# Patient Record
Sex: Male | Born: 1970 | Race: White | Hispanic: No | Marital: Married | Smoking: Former smoker
Health system: Southern US, Community
[De-identification: ages and names within clinical notes are randomized; demographics above are authoritative.]

## PROBLEM LIST (undated history)

## (undated) DIAGNOSIS — I10 Essential (primary) hypertension: Secondary | ICD-10-CM

## (undated) DIAGNOSIS — I350 Nonrheumatic aortic (valve) stenosis: Principal | ICD-10-CM

## (undated) DIAGNOSIS — K219 Gastro-esophageal reflux disease without esophagitis: Secondary | ICD-10-CM

## (undated) DIAGNOSIS — M109 Gout, unspecified: Secondary | ICD-10-CM

## (undated) DIAGNOSIS — E785 Hyperlipidemia, unspecified: Secondary | ICD-10-CM

## (undated) DIAGNOSIS — IMO0001 Reserved for inherently not codable concepts without codable children: Secondary | ICD-10-CM

## (undated) DIAGNOSIS — T4145XA Adverse effect of unspecified anesthetic, initial encounter: Secondary | ICD-10-CM

## (undated) DIAGNOSIS — R51 Headache: Secondary | ICD-10-CM

## (undated) DIAGNOSIS — I4891 Unspecified atrial fibrillation: Secondary | ICD-10-CM

## (undated) DIAGNOSIS — R42 Dizziness and giddiness: Secondary | ICD-10-CM

## (undated) DIAGNOSIS — R519 Headache, unspecified: Secondary | ICD-10-CM

## (undated) DIAGNOSIS — Z87442 Personal history of urinary calculi: Secondary | ICD-10-CM

## (undated) DIAGNOSIS — M1990 Unspecified osteoarthritis, unspecified site: Secondary | ICD-10-CM

## (undated) DIAGNOSIS — T8859XA Other complications of anesthesia, initial encounter: Secondary | ICD-10-CM

## (undated) DIAGNOSIS — R011 Cardiac murmur, unspecified: Secondary | ICD-10-CM

## (undated) HISTORY — PX: CARDIAC CATHETERIZATION: SHX172

## (undated) HISTORY — DX: Gout, unspecified: M10.9

## (undated) HISTORY — DX: Unspecified atrial fibrillation: I48.91

## (undated) HISTORY — DX: Nonrheumatic aortic (valve) stenosis: I35.0

## (undated) HISTORY — PX: HERNIA REPAIR: SHX51

## (undated) HISTORY — DX: Essential (primary) hypertension: I10

## (undated) HISTORY — DX: Unspecified osteoarthritis, unspecified site: M19.90

## (undated) HISTORY — DX: Cardiac murmur, unspecified: R01.1

## (undated) HISTORY — PX: OTHER SURGICAL HISTORY: SHX169

## (undated) HISTORY — DX: Hyperlipidemia, unspecified: E78.5

## (undated) HISTORY — PX: LITHOTRIPSY: SUR834

---

## 2016-07-09 ENCOUNTER — Other Ambulatory Visit: Payer: Self-pay | Admitting: *Deleted

## 2016-07-09 ENCOUNTER — Encounter: Payer: Self-pay | Admitting: Cardiothoracic Surgery

## 2016-07-09 ENCOUNTER — Institutional Professional Consult (permissible substitution) (INDEPENDENT_AMBULATORY_CARE_PROVIDER_SITE_OTHER): Payer: Managed Care, Other (non HMO) | Admitting: Cardiothoracic Surgery

## 2016-07-09 DIAGNOSIS — I350 Nonrheumatic aortic (valve) stenosis: Secondary | ICD-10-CM | POA: Diagnosis not present

## 2016-07-09 DIAGNOSIS — Q231 Congenital insufficiency of aortic valve: Secondary | ICD-10-CM

## 2016-07-09 HISTORY — DX: Nonrheumatic aortic (valve) stenosis: I35.0

## 2016-07-09 NOTE — Progress Notes (Signed)
PCP is No primary care provider on file. Referring Provider is No ref. provider found  Chief Complaint  Patient presents with  . Aortic Stenosis    Surgical eval, Cardiac Cath 07/04/16, ECHO 06/27/16  Patient examined, cardiac catheterization images and right heart cath data personally reviewed and counseled with patient   HPI: I was asked to evaluate this 45 year old obese nondiabetic reformed smoker with a diagnosis of severe aortic stenosis and moderate aortic insufficiency with exertional symptoms of shortness of breath and dizziness-presyncope. The patient was found have a cardiac murmur and 2010. At that time echocardiogram demonstrated no significant valvular abnormalities according to the patient. At that time he was asymptomatic. No further echoes were performed until recent onset of symptoms-dyspnea on exertion and 2 or 3 episodes of exertional presyncope or dizziness. The patient denies any resting symptoms of chest pain, orthopnea, PND, or ankle edema. There is no family history of aortic valve disease. The patients cardiologist, Dr. Daryel November, feels the patient has a bicuspid aortic valve. The echo disc the patient brought today does not provide images of the valve to interpret at this time. Left and right heart cath data shows a mildly dilated ascending aorta with well-preserved LV systolic function. He would aortic valve area was 0.8 with a heavily calcified aortic valve. LVEDP is measured at 20 mmHg. Cardiac output was 5.5 L/m. Were no significant coronary artery lesions. There is no bleeding from the groin puncture and no residual hematoma. ` Past Medical History:  Diagnosis Date  . Aortic stenosis 07/09/2016  . Arthritis   . Gout   . Heart murmur   . Hyperlipidemia   . Hypertension     Past Surgical History:  Procedure Laterality Date  . CARDIAC CATHETERIZATION    . I&D right Knee    . kidney stents      Family History  Problem Relation Age of Onset  . Diabetes Mother    . Hypertension Mother   . Asthma Mother   . Hyperlipidemia Father   . Hypertension Father     Social History Social History  Substance Use Topics  . Smoking status: Former Smoker    Packs/day: 1.00    Years: 15.00    Types: Cigarettes    Quit date: 12/18/2003  . Smokeless tobacco: Current User    Types: Chew  . Alcohol use Yes     Comment: occas.  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  Current Outpatient Prescriptions  Medication Sig Dispense Refill  . allopurinol (ZYLOPRIM) 300 MG tablet Take 300 mg by mouth daily.    Marland Kitchen aspirin 81 MG tablet Take 81 mg by mouth daily.    Marland Kitchen atorvastatin (LIPITOR) 40 MG tablet Take 40 mg by mouth daily at 6 PM.    . carvedilol (COREG) 6.25 MG tablet Take 6.25 mg by mouth 2 (two) times daily with a meal.    . indomethacin (INDOCIN) 50 MG capsule Take 50 mg by mouth 3 (three) times daily as needed.    Marland Kitchen lisinopril (PRINIVIL,ZESTRIL) 20 MG tablet Take 20 mg by mouth daily.     No current facility-administered medications for this visit.     No Known Allergies  Review of Systems        Review of Systems :  [ y ] = yes, [  ] = no        General :  Weight gain [   ]  Weight loss  [   ]  Fatigue [ y ]  Fever [  ]  Chills  [  ]                                Weakness  [  ]           HEENT    Headache [  ]  Dizziness [  ]  Blurred vision [ y ] Glaucoma  [  ]                          Nosebleeds [  ] Painful or loose teeth [  ]        Cardiac :  Chest pain/ pressure [  ]  Resting SOB [  ] exertional SOB Cove.Etienne  ]                        Orthopnea [  ]  Pedal edema  [  ]  Palpitations [  ] Syncope/presyncope Cove.Etienne ]                        Paroxysmal nocturnal dyspnea [  ]         Pulmonary : cough [  ]  wheezing [  ]  Hemoptysis [  ] Sputum [  ] Snoring [  ]                              Pneumothorax [  ]  Sleep apnea [ possible ]        GI : Vomiting [  ]  Dysphagia [  ]  Melena  [  ]  Abdominal pain [  ]  BRBPR [  ]              Heart burn [  ]  Constipation [  ] Diarrhea  [  ] Colonoscopy [   ]        GU : Hematuria [  ]  Dysuria [  ]  Nocturia [  ] UTI's [  ]        Vascular : Claudication [  ]  Rest pain [  ]  DVT [  ] Vein stripping [  ] leg ulcers [  ]                          TIA [  ] Stroke [  ]  Varicose veins [  ]        NEURO :  Headaches  [  ] Seizures [  ] Vision changes [  ] Paresthesias [  ]                                       Seizures [  ]        Musculoskeletal :  Arthritis [  ] Gout  [  yes-status post right knee arthroscopy for gouty arthritis]  Back pain [  ]  Joint pain [ yes ]        Skin :  Rash [  ]  Melanoma [  ] Sores [  ]  Heme : Bleeding problems [  ]Clotting Disorders [  ] Anemia [  ]Blood Transfusion [ ]         Endocrine : Diabetes [  ] Heat or Cold intolerance [  ] Polyuria [  ]excessive thirst [ ]         Psych : Depression [  ]  Anxiety [  ]  Psych hospitalizations [  ] Memory change [  ]      Patient is right-hand dominant      Patient has had general anesthesia for right knee surgery, right inguinal repair and transurethral extraction of kidney stones. No problems with anesthesia or bleeding problems with surgery.                                          BP 107/62 (BP Location: Right Arm, Patient Position: Sitting, Cuff Size: Large)   Pulse 88   Resp 20   Ht 5\' 6"  (1.676 m)   Wt 235 lb (106.6 kg)   SpO2 97% Comment: RA  BMI 37.93 kg/m  Physical Exam      Physical Exam  General: Short statured obese Caucasian male no acute distress HEENT: Normocephalic pupils equal , dentition adequate Neck: Supple without JVD, adenopathy, or bruit, transmitted murmur of aortic stenosis  in the neck Chest: Clear to auscultation, symmetrical breath sounds, no rhonchi, no tenderness             or deformity Cardiovascular: Regular rate and rhythm, 3/6 murmur of AS, 2/6 murmur of AI,, no gallop, peripheral pulses             palpable in all  extremities Abdomen:  Soft, obese, nontender, no palpable mass or organomegaly Extremities: Warm, well-perfused, no clubbing cyanosis edema or tenderness,              no venous stasis changes of the legs Rectal/GU: Deferred Neuro: Grossly non--focal and symmetrical throughout Skin: Clean and dry without rash or ulceration   Diagnostic Tests: Coronary angiogram-no significant CAD LV gram-normal systolic function Aortogram-at least mild dilatation of the ascending aorta Echocardiogram-images not reproducible on disc brought by patient  Impression: Moderate-severe aortic stenosis, moderate aortic insufficiency, symptoms of CHF and presyncope. No significant CAD. Possible dilatation of the ascending aorta related to a bicuspid valve  Plan: I discussed the procedure of aortic valve replacement in detail the patient. At age 62 I recommended mechanical valve replacement and lifelong commitment to Coumadin therapy. The patient is somewhat hesitant to accept that recommendation and will further discuss with his family. I recommended sternotomy as the safest approach for aVR in this short statured obese patient to allow adequate exposure for the largest valve possible. The patient will undergo CT of his thoracic aorta to obtain better assessment of possible fusiform aneurysm. The patient will have a dental exam by his family dentist as he is not been assessed and over 1-2 years. Family will complete a planned vacation at the beach and return him in mid  August to discuss timing of surgery, the choice of valve mechanical versus bio prosthetic, and at that time his CT scan of chest will be reviewed and discussed with patient. He will continue his current medications until then. We discussed activity limitations he should follow until he undergoes aortic valve replacement.  Mikey Bussing, MD [facility] [phone]

## 2016-07-25 LAB — PROTIME-INR

## 2016-08-01 ENCOUNTER — Ambulatory Visit (INDEPENDENT_AMBULATORY_CARE_PROVIDER_SITE_OTHER): Payer: Managed Care, Other (non HMO) | Admitting: Cardiothoracic Surgery

## 2016-08-01 ENCOUNTER — Other Ambulatory Visit: Payer: Self-pay | Admitting: *Deleted

## 2016-08-01 ENCOUNTER — Ambulatory Visit
Admission: RE | Admit: 2016-08-01 | Discharge: 2016-08-01 | Disposition: A | Discharge status: Home / Self Care | Payer: Managed Care, Other (non HMO) | Source: Ambulatory Visit | Attending: Cardiothoracic Surgery | Admitting: Cardiothoracic Surgery

## 2016-08-01 ENCOUNTER — Encounter: Payer: Self-pay | Admitting: Cardiothoracic Surgery

## 2016-08-01 VITALS — BP 103/63 | HR 88 | Resp 20 | Ht 66.0 in | Wt 235.0 lb

## 2016-08-01 DIAGNOSIS — I350 Nonrheumatic aortic (valve) stenosis: Secondary | ICD-10-CM

## 2016-08-01 DIAGNOSIS — Q231 Congenital insufficiency of aortic valve: Secondary | ICD-10-CM

## 2016-08-01 MED ORDER — IOPAMIDOL (ISOVUE-370) INJECTION 76%
80.0000 mL | Freq: Once | INTRAVENOUS | Status: AC | PRN
  Administered 2016-08-01: 80 mL via INTRAVENOUS

## 2016-08-01 NOTE — Progress Notes (Signed)
PCP is No primary care provider on file. Referring Provider is Daryel NovemberMiller, Gary, MD  Chief Complaint  Patient presents with  . Aortic Stenosis    f/u to review CTA Chest   HPI: I was asked to evaluate this 45 year old obese nondiabetic reformed smoker with a diagnosis of severe aortic stenosis and moderate aortic insufficiency with exertional symptoms of shortness of breath and dizziness-presyncope. The patient was found have a cardiac murmur and 2010. At that time echocardiogram demonstrated no significant valvular abnormalities according to the patient. At that time he was asymptomatic. No further echoes were performed until recent onset of symptoms-dyspnea on exertion and 2 or 3 episodes of exertional presyncope or dizziness. The patient denies any resting symptoms of chest pain, orthopnea, PND, or ankle edema. There is no family history of aortic valve disease. The patients cardiologist, Dr. Daryel NovemberGary Miller, feels the patient has a bicuspid aortic valve. The echo disc the patient brought today does not provide images of the valve to interpret at this time. Left and right heart cath data shows a mildly dilated ascending aorta with well-preserved LV systolic function. He would aortic valve area was 0.8 with a heavily calcified aortic valve. LVEDP is measured at 20 mmHg. Cardiac output was 5.5 L/m. Were no significant coronary artery lesions.   Since initial consultation the patient has had some episodes of transient dizziness. His exertional shortness of breath has not worsened. He denies any symptoms of upper respiratory infection. Prior to today's visit the patient underwent a CT scan of the chest which demonstrated the ascending aorta diameter at 4.2 cm which would not require replacement in this patient with a large BSA.  Since the patient's initial consultation he was examined by his dentist and has documentation that his oral hygiene is adequate for valve replacement surgery.  Since initial  consultation the patient and his family have discussed the options of tissue valve versus mechanical valve. They understand that I would recommend mechanical valve due to the patient's young age and the risk of premature calcification. There is still concern over a lifelong commitment to Coumadin therapy and we'll decide on the valve category prior to surgery.   Past Medical History:  Diagnosis Date  . Aortic stenosis 07/09/2016  . Arthritis   . Gout   . Heart murmur   . Hyperlipidemia   . Hypertension     Past Surgical History:  Procedure Laterality Date  . CARDIAC CATHETERIZATION    . I&D right Knee    . kidney stents      Family History  Problem Relation Age of Onset  . Diabetes Mother   . Hypertension Mother   . Asthma Mother   . Hyperlipidemia Father   . Hypertension Father     Social History Social History  Substance Use Topics  . Smoking status: Former Smoker    Packs/day: 1.00    Years: 15.00    Types: Cigarettes    Quit date: 12/18/2003  . Smokeless tobacco: Current User    Types: Chew  . Alcohol use Yes     Comment: occas.    Current Outpatient Prescriptions  Medication Sig Dispense Refill  . allopurinol (ZYLOPRIM) 300 MG tablet Take 300 mg by mouth daily.    Marland Kitchen. aspirin 81 MG tablet Take 81 mg by mouth daily.    Marland Kitchen. atorvastatin (LIPITOR) 40 MG tablet Take 40 mg by mouth daily at 6 PM.    . carvedilol (COREG) 6.25 MG tablet Take 6.25 mg by mouth  2 (two) times daily with a meal.    . indomethacin (INDOCIN) 50 MG capsule Take 50 mg by mouth 3 (three) times daily as needed.    Marland Kitchen. lisinopril (PRINIVIL,ZESTRIL) 20 MG tablet Take 20 mg by mouth daily.     No current facility-administered medications for this visit.     No Known Allergies  Review of Systems: Review of Systems :  [ y ] = yes, [  ] = no         General :  Weight gain [   ]    Weight loss  [   ]  Fatigue [ y ]  Fever [  ]  Chills  [  ]                                Weakness  [  ]             HEENT    Headache [  ]  Dizziness [  ]  Blurred vision [ y ] Glaucoma  [  ]                          Nosebleeds [  ] Painful or loose teeth [  ]         Cardiac :  Chest pain/ pressure [  ]  Resting SOB [  ] exertional SOB Cove.Etienne[y  ]                        Orthopnea [  ]  Pedal edema  [  ]  Palpitations [  ] Syncope/presyncope Cove.Etienne[y ]                        Paroxysmal nocturnal dyspnea [  ]          Pulmonary : cough [  ]  wheezing [  ]  Hemoptysis [  ] Sputum [  ] Snoring [  ]                              Pneumothorax [  ]  Sleep apnea [ possible ]         GI : Vomiting [  ]  Dysphagia [  ]  Melena  [  ]  Abdominal pain [  ] BRBPR [  ]              Heart burn [  ]  Constipation [  ] Diarrhea  [  ] Colonoscopy [   ]         GU : Hematuria [  ]  Dysuria [  ]  Nocturia [  ] UTI's [  ]         Vascular : Claudication [  ]  Rest pain [  ]  DVT [  ] Vein stripping [  ] leg ulcers [  ]                          TIA [  ] Stroke [  ]  Varicose veins [  ]         NEURO :  Headaches  [  ] Seizures [  ] Vision changes [  ] Paresthesias [  ]  Seizures [  ]         Musculoskeletal :  Arthritis [  ] Gout  [  yes-status post right knee arthroscopy for gouty arthritis]  Back pain [  ]  Joint pain [ yes ]         Skin :  Rash [  ]  Melanoma [  ] Sores [  ]         Heme : Bleeding problems [  ]Clotting Disorders [  ] Anemia [  ]Blood Transfusion [ ]          Endocrine : Diabetes [  ] Heat or Cold intolerance [  ] Polyuria [  ]excessive thirst [ ]          Psych : Depression [  ]  Anxiety [  ]  Psych hospitalizations [  ] Memory change [  ]      Patient is right-hand dominant      Patient has had general anesthesia for right knee surgery, right inguinal repair and transurethral extraction of kidney stones. No problems with anesthesia or bleeding problems with surgery.       BP 103/63 (BP Location: Left Arm, Patient Position: Sitting, Cuff Size: Large)   Pulse 88   Resp 20    Ht 5\' 6"  (1.676 m)   Wt 235 lb (106.6 kg)   SpO2 98% Comment: RA  BMI 37.93 kg/m  Physical Exam:       Physical Exam  General: Obese short statured Caucasian male no acute distress accompanied by family HEENT: Normocephalic pupils equal , dentition adequate Neck: Supple without JVD, adenopathy, or bruit Chest: Clear to auscultation, symmetrical breath sounds, no rhonchi, no tenderness             or deformity Cardiovascular: Regular rate and rhythm, 3/6 aortic stenosis murmur, 2/6 murmur of AI, no gallop, peripheral pulses             palpable in all extremities Abdomen:  Soft, nontender, no palpable mass or organomegaly Extremities: Warm, well-perfused, no clubbing cyanosis edema or tenderness,              no venous stasis changes of the legs Rectal/GU: Deferred Neuro: Grossly non--focal and symmetrical throughout Skin: Clean and dry without rash or ulceration   Diagnostic Tests: Review of CT scan images performed personally and counseled with patient and family. Reviewed the results of previous coronary angiogram and echocardiogram performed in [place].  Impression: Moderate to severe aortic stenosis, moderate aortic insufficiency Preserved LV systolic function Aortic peak gradient 74 mmHg, mean gradient 44 mmHg. CT scan of chest shows insignificant dilatation of the ascending aorta Dental clearance for elective valve replacement surgery obtained by patient's personal dentist  Plan: Patient be scheduled for aortic valve replacement on August 29. I have again reviewed in detail the indications benefits alternatives and risks of surgery as well as the expected postoperative hospital recovery. The patient will inform me of his choice of mechanical versus bioprosthetic valve prior to surgery. We have discussed the pros and cons each type at length and dressed all questions.  Mikey Bussing, MD [facility] [phone]

## 2016-08-02 ENCOUNTER — Other Ambulatory Visit: Payer: Self-pay | Admitting: *Deleted

## 2016-08-08 ENCOUNTER — Ambulatory Visit (HOSPITAL_BASED_OUTPATIENT_CLINIC_OR_DEPARTMENT_OTHER)
Admission: RE | Admit: 2016-08-08 | Discharge: 2016-08-08 | Disposition: A | Discharge status: Home / Self Care | Payer: Managed Care, Other (non HMO) | Source: Ambulatory Visit | Attending: Cardiothoracic Surgery | Admitting: Cardiothoracic Surgery

## 2016-08-08 ENCOUNTER — Other Ambulatory Visit (HOSPITAL_COMMUNITY): Payer: Managed Care, Other (non HMO)

## 2016-08-08 ENCOUNTER — Ambulatory Visit (HOSPITAL_COMMUNITY)
Admission: RE | Admit: 2016-08-08 | Discharge: 2016-08-08 | Disposition: A | Discharge status: Home / Self Care | Payer: Managed Care, Other (non HMO) | Source: Ambulatory Visit | Attending: Cardiothoracic Surgery | Admitting: Cardiothoracic Surgery

## 2016-08-08 ENCOUNTER — Encounter (HOSPITAL_COMMUNITY)
Admission: RE | Admit: 2016-08-08 | Discharge: 2016-08-08 | Disposition: A | Discharge status: Home / Self Care | Payer: Managed Care, Other (non HMO) | Source: Ambulatory Visit | Attending: Cardiothoracic Surgery | Admitting: Cardiothoracic Surgery

## 2016-08-08 ENCOUNTER — Encounter (HOSPITAL_COMMUNITY): Payer: Self-pay

## 2016-08-08 DIAGNOSIS — Z01818 Encounter for other preprocedural examination: Secondary | ICD-10-CM | POA: Insufficient documentation

## 2016-08-08 DIAGNOSIS — I350 Nonrheumatic aortic (valve) stenosis: Secondary | ICD-10-CM | POA: Insufficient documentation

## 2016-08-08 DIAGNOSIS — Z0183 Encounter for blood typing: Secondary | ICD-10-CM | POA: Diagnosis not present

## 2016-08-08 DIAGNOSIS — I6523 Occlusion and stenosis of bilateral carotid arteries: Secondary | ICD-10-CM | POA: Diagnosis not present

## 2016-08-08 DIAGNOSIS — Z01812 Encounter for preprocedural laboratory examination: Secondary | ICD-10-CM | POA: Diagnosis not present

## 2016-08-08 DIAGNOSIS — R9431 Abnormal electrocardiogram [ECG] [EKG]: Secondary | ICD-10-CM | POA: Insufficient documentation

## 2016-08-08 HISTORY — DX: Personal history of urinary calculi: Z87.442

## 2016-08-08 HISTORY — DX: Gastro-esophageal reflux disease without esophagitis: K21.9

## 2016-08-08 HISTORY — DX: Reserved for inherently not codable concepts without codable children: IMO0001

## 2016-08-08 HISTORY — DX: Headache, unspecified: R51.9

## 2016-08-08 HISTORY — DX: Dizziness and giddiness: R42

## 2016-08-08 HISTORY — DX: Other complications of anesthesia, initial encounter: T88.59XA

## 2016-08-08 HISTORY — DX: Headache: R51

## 2016-08-08 HISTORY — DX: Adverse effect of unspecified anesthetic, initial encounter: T41.45XA

## 2016-08-08 LAB — SURGICAL PCR SCREEN
MRSA, PCR: NEGATIVE
Staphylococcus aureus: NEGATIVE

## 2016-08-08 LAB — PULMONARY FUNCTION TEST
DL/VA % pred: 116 %
DL/VA: 5.12 ml/min/mmHg/L
DLCO unc % pred: 105 %
DLCO unc: 28.32 ml/min/mmHg
FEF 25-75 Post: 3.16 L/sec
FEF 25-75 Pre: 3.39 L/sec
FEF2575-%Change-Post: -6 %
FEF2575-%Pred-Post: 96 %
FEF2575-%Pred-Pre: 103 %
FEV1-%Change-Post: -2 %
FEV1-%Pred-Post: 89 %
FEV1-%Pred-Pre: 92 %
FEV1-Post: 3.18 L
FEV1-Pre: 3.27 L
FEV1FVC-%Change-Post: 0 %
FEV1FVC-%Pred-Pre: 104 %
FEV6-%Change-Post: -2 %
FEV6-%Pred-Post: 89 %
FEV6-%Pred-Pre: 91 %
FEV6-Post: 3.9 L
FEV6-Pre: 3.99 L
FEV6FVC-%Change-Post: 0 %
FEV6FVC-%Pred-Post: 103 %
FEV6FVC-%Pred-Pre: 103 %
FVC-%Change-Post: -2 %
FVC-%Pred-Post: 86 %
FVC-%Pred-Pre: 89 %
FVC-Post: 3.9 L
FVC-Pre: 4 L
Post FEV1/FVC ratio: 82 %
Post FEV6/FVC ratio: 100 %
Pre FEV1/FVC ratio: 82 %
Pre FEV6/FVC Ratio: 100 %
RV % pred: 97 %
RV: 1.7 L
TLC % pred: 92 %
TLC: 5.67 L

## 2016-08-08 LAB — VAS US DOPPLER PRE CABG
LEFT ECA DIAS: -11 cm/s
LEFT VERTEBRAL DIAS: 12 cm/s
Left CCA dist dias: -16 cm/s
Left CCA dist sys: -132 cm/s
Left CCA prox dias: 9 cm/s
Left CCA prox sys: 122 cm/s
Left ICA dist dias: -24 cm/s
Left ICA dist sys: -86 cm/s
Left ICA prox dias: -23 cm/s
Left ICA prox sys: -141 cm/s
RIGHT ECA DIAS: -10 cm/s
RIGHT VERTEBRAL DIAS: 9 cm/s
Right CCA prox dias: 13 cm/s
Right CCA prox sys: 127 cm/s
Right cca dist sys: -77 cm/s

## 2016-08-08 LAB — TYPE AND SCREEN
ABO/RH(D): B POS
Antibody Screen: NEGATIVE

## 2016-08-08 LAB — CBC
HCT: 45.9 % (ref 39.0–52.0)
Hemoglobin: 15 g/dL (ref 13.0–17.0)
MCH: 29.2 pg (ref 26.0–34.0)
MCHC: 32.7 g/dL (ref 30.0–36.0)
MCV: 89.3 fL (ref 78.0–100.0)
Platelets: 225 10*3/uL (ref 150–400)
RBC: 5.14 MIL/uL (ref 4.22–5.81)
RDW: 14.2 % (ref 11.5–15.5)
WBC: 9 10*3/uL (ref 4.0–10.5)

## 2016-08-08 LAB — COMPREHENSIVE METABOLIC PANEL
ALT: 22 U/L (ref 17–63)
AST: 26 U/L (ref 15–41)
Albumin: 4 g/dL (ref 3.5–5.0)
Alkaline Phosphatase: 64 U/L (ref 38–126)
Anion gap: 8 (ref 5–15)
BUN: 12 mg/dL (ref 6–20)
CO2: 23 mmol/L (ref 22–32)
Calcium: 9.9 mg/dL (ref 8.9–10.3)
Chloride: 107 mmol/L (ref 101–111)
Creatinine, Ser: 1.4 mg/dL — ABNORMAL HIGH (ref 0.61–1.24)
GFR calc Af Amer: >60 mL/min (ref >60)
GFR calc non Af Amer: 59 mL/min — ABNORMAL LOW (ref >60)
Glucose, Bld: 80 mg/dL (ref 65–99)
Potassium: 3.8 mmol/L (ref 3.5–5.1)
Sodium: 138 mmol/L (ref 135–145)
Total Bilirubin: 0.7 mg/dL (ref 0.3–1.2)
Total Protein: 6.9 g/dL (ref 6.5–8.1)

## 2016-08-08 LAB — URINALYSIS, ROUTINE W REFLEX MICROSCOPIC
Bilirubin Urine: NEGATIVE
Glucose, UA: NEGATIVE mg/dL
Hgb urine dipstick: NEGATIVE
Ketones, ur: NEGATIVE mg/dL
Leukocytes, UA: NEGATIVE
Nitrite: NEGATIVE
Protein, ur: NEGATIVE mg/dL
Specific Gravity, Urine: 1.016 (ref 1.005–1.030)
pH: 6 (ref 5.0–8.0)

## 2016-08-08 LAB — PROTIME-INR
INR: 1.05
Prothrombin Time: 13.7 seconds (ref 11.4–15.2)

## 2016-08-08 LAB — ABO/RH: ABO/RH(D): B POS

## 2016-08-08 LAB — APTT: aPTT: 31 seconds (ref 24–36)

## 2016-08-08 MED ORDER — ALBUTEROL SULFATE (2.5 MG/3ML) 0.083% IN NEBU
2.5000 mg | INHALATION_SOLUTION | Freq: Once | RESPIRATORY_TRACT | Status: AC
  Administered 2016-08-08: 2.5 mg via RESPIRATORY_TRACT

## 2016-08-08 MED ORDER — CHLORHEXIDINE GLUCONATE 4 % EX LIQD: 30.0000 mL | CUTANEOUS | Status: DC

## 2016-08-08 NOTE — Progress Notes (Addendum)
Cardiologist is Dr.Gary Hyacinth MeekerMiller in [place],to request last visit [phone](phone) ([phone]  [facility] [phone](phone) and (fax) [phone]  Echo report in epic from 06/2016  Heart cath report in epic from 2017  Stress test denies

## 2016-08-08 NOTE — Progress Notes (Signed)
   08/08/16 1408  OBSTRUCTIVE SLEEP APNEA  Have you ever been diagnosed with sleep apnea through a sleep study? No  Do you snore loudly (loud enough to be heard through closed doors)?  1  Do you often feel tired, fatigued, or sleepy during the daytime (such as falling asleep during driving or talking to someone)? 0  Has anyone observed you stop breathing during your sleep? 1  Do you have, or are you being treated for high blood pressure? 1  BMI more than 35 kg/m2? 1  Age > 50 (1-yes) 0  Neck circumference greater than:Male 16 inches or larger, Male 17inches or larger? 1 24(19)  Male Gender (Yes=1) 1  Obstructive Sleep Apnea Score 6  Score 5 or greater  Results sent to PCP

## 2016-08-08 NOTE — Pre-Procedure Instructions (Signed)
[name] BunStephane W [name]  08/08/2016      [facility] [place] [address] [place] [id number] Phone: [phone] Fax: [phone]    Your procedure is scheduled on Tues, Aug 29 @ 7:30 AM  [facility] Admitting at 5:30 AM  Call this number if you have problems the morning of surgery:  [phone]   Remember:  Do not eat food or drink liquids after midnight.  Take these medicines the morning of surgery with A SIP OF WATER Carvedilol(Coreg)              Stop taking your Indomethacin. No Goody's,BC's,Aleve,Advil,Motrin,Ibuprofen,Fish Oil,or any Herbal Medications.    Do not wear jewelry,.  Do not wear lotions, powders,colognes,or deoderant.  Men may shave face and neck.  Do not bring valuables to the hospital.  [facility] is not responsible for any belongings or valuables.  Contacts, dentures or bridgework may not be worn into surgery.  Leave your suitcase in the car.  After surgery it may be brought to your room.  For patients admitted to the hospital, discharge time will be determined by your treatment team.  Patients discharged the day of surgery will not be allowed to drive home.    [facility]  Before surgery, you can play an important role.  Because skin is not sterile, your skin needs to be as free of germs as possible.  You can reduce the number of germs on you skin by washing with CHG (chlorahexidine gluconate) soap before surgery.  CHG is an antiseptic cleaner which kills germs and bonds with the skin to continue killing germs even after washing.  Please DO NOT use if you have an allergy to CHG or antibacterial soaps.  If your skin becomes reddened/irritated stop using the CHG and inform your nurse when you arrive at Short Stay.  Do not shave (including legs and underarms) for at least 48 hours prior to the first CHG shower.  You may shave your face.  Please  follow these instructions carefully:   1.  Shower with CHG Soap the night before surgery and the                                morning of Surgery.  2.  If you choose to wash your hair, wash your hair first as usual with your       normal shampoo.  3.  After you shampoo, rinse your hair and body thoroughly to remove the                      Shampoo.  4.  Use CHG as you would any other liquid soap.  You can apply chg directly       to the skin and wash gently with scrungie or a clean washcloth.  5.  Apply the CHG Soap to your body ONLY FROM THE NECK DOWN.        Do not use on open wounds or open sores.  Avoid contact with your eyes,       ears, mouth and genitals (private parts).  Wash genitals (private parts)       with your normal soap.  6.  Wash thoroughly, paying special attention to the area where your surgery        will be performed.  7.  Thoroughly rinse your body with warm water from the neck down.  8.  DO NOT shower/wash with your normal soap after using and rinsing off       the CHG Soap.  9.  Pat yourself dry with a clean towel.            10.  Wear clean pajamas.            11.  Place clean sheets on your bed the night of your first shower and do not        sleep with pets.  Day of Surgery  Do not apply any lotions/deoderants the morning of surgery.  Please wear clean clothes to the hospital/surgery center.    Please read over the following fact sheets that you were given. MRSA Information and Surgical Site Infection Prevention

## 2016-08-09 LAB — HEMOGLOBIN A1C
Hgb A1c MFr Bld: 6.2 % — ABNORMAL HIGH (ref 4.8–5.6)
Mean Plasma Glucose: 131 mg/dL

## 2016-08-13 MED ORDER — POTASSIUM CHLORIDE 2 MEQ/ML IV SOLN
80.0000 meq | INTRAVENOUS | Status: DC
  Filled 2016-08-13: qty 40

## 2016-08-13 MED ORDER — SODIUM CHLORIDE 0.9 % IV SOLN
INTRAVENOUS | Status: AC
  Administered 2016-08-14: 1.4 [IU]/h via INTRAVENOUS
  Filled 2016-08-13: qty 2.5

## 2016-08-13 MED ORDER — VANCOMYCIN HCL 10 G IV SOLR
1500.0000 mg | INTRAVENOUS | Status: AC
  Administered 2016-08-14: 1500 mg via INTRAVENOUS
  Filled 2016-08-13: qty 1500

## 2016-08-13 MED ORDER — DOPAMINE-DEXTROSE 3.2-5 MG/ML-% IV SOLN
0.0000 ug/kg/min | INTRAVENOUS | Status: AC
  Administered 2016-08-14: 2 ug/kg/min via INTRAVENOUS
  Filled 2016-08-13: qty 250

## 2016-08-13 MED ORDER — CHLORHEXIDINE GLUCONATE 0.12 % MT SOLN
15.0000 mL | Freq: Once | OROMUCOSAL | Status: DC
  Filled 2016-08-13: qty 15

## 2016-08-13 MED ORDER — DEXTROSE 5 % IV SOLN
750.0000 mg | INTRAVENOUS | Status: DC
  Filled 2016-08-13: qty 750

## 2016-08-13 MED ORDER — DEXMEDETOMIDINE HCL IN NACL 400 MCG/100ML IV SOLN
0.1000 ug/kg/h | INTRAVENOUS | Status: AC
  Administered 2016-08-14: .3 ug/kg/h via INTRAVENOUS
  Filled 2016-08-13: qty 100

## 2016-08-13 MED ORDER — MAGNESIUM SULFATE 50 % IJ SOLN
40.0000 meq | INTRAMUSCULAR | Status: DC
  Filled 2016-08-13: qty 10

## 2016-08-13 MED ORDER — SODIUM CHLORIDE 0.9 % IV SOLN
INTRAVENOUS | Status: AC
  Administered 2016-08-14: 69.8 mL/h via INTRAVENOUS
  Filled 2016-08-13: qty 40

## 2016-08-13 MED ORDER — PHENYLEPHRINE HCL 10 MG/ML IJ SOLN
30.0000 ug/min | INTRAVENOUS | Status: AC
  Administered 2016-08-14: 50 ug/min via INTRAVENOUS
  Filled 2016-08-13: qty 2

## 2016-08-13 MED ORDER — EPINEPHRINE HCL 1 MG/ML IJ SOLN
0.0000 ug/min | INTRAVENOUS | Status: DC
  Filled 2016-08-13: qty 4

## 2016-08-13 MED ORDER — PLASMA-LYTE 148 IV SOLN
INTRAVENOUS | Status: AC
  Administered 2016-08-14: 500 mL
  Filled 2016-08-13: qty 2.5

## 2016-08-13 MED ORDER — HEPARIN SODIUM (PORCINE) 1000 UNIT/ML IJ SOLN
INTRAMUSCULAR | Status: DC
  Filled 2016-08-13: qty 30

## 2016-08-13 MED ORDER — METOPROLOL TARTRATE 12.5 MG HALF TABLET: 12.5000 mg | ORAL_TABLET | Freq: Once | ORAL | Status: DC

## 2016-08-13 MED ORDER — NITROGLYCERIN IN D5W 200-5 MCG/ML-% IV SOLN
2.0000 ug/min | INTRAVENOUS | Status: DC
  Filled 2016-08-13: qty 250

## 2016-08-13 MED ORDER — CEFUROXIME SODIUM 1.5 G IJ SOLR
1.5000 g | INTRAMUSCULAR | Status: AC
  Administered 2016-08-14: 1.5 g via INTRAVENOUS
  Administered 2016-08-14: .75 g via INTRAVENOUS
  Filled 2016-08-13 (×2): qty 1.5

## 2016-08-13 NOTE — Anesthesia Preprocedure Evaluation (Addendum)
Anesthesia Evaluation  Patient identified by MRN, date of birth, ID band Patient awake    Airway Mallampati: II  TM Distance: >3 FB Neck ROM: Full    Dental  (+) Dental Advisory Given, Teeth Intact   Pulmonary former smoker,    breath sounds clear to auscultation       Cardiovascular hypertension, + Valvular Problems/Murmurs AS  Rhythm:Regular Rate:Normal + Systolic murmurs    Neuro/Psych negative neurological ROS     GI/Hepatic GERD  ,  Endo/Other  Morbid obesity  Renal/GU      Musculoskeletal  (+) Arthritis ,   Abdominal (+) + obese,   Peds  Hematology   Anesthesia Other Findings   Reproductive/Obstetrics                           Anesthesia Physical Anesthesia Plan  ASA: III  Anesthesia Plan: General   Post-op Pain Management:    Induction: Intravenous  Airway Management Planned: Oral ETT  Additional Equipment: Arterial line, 3D TEE, Ultrasound Guidance Line Placement and PA Cath  Intra-op Plan:   Post-operative Plan: Post-operative intubation/ventilation  Informed Consent: I have reviewed the patients History and Physical, chart, labs and discussed the procedure including the risks, benefits and alternatives for the proposed anesthesia with the patient or authorized representative who has indicated his/her understanding and acceptance.   Dental advisory given  Plan Discussed with:   Anesthesia Plan Comments:         Anesthesia Quick Evaluation

## 2016-08-14 ENCOUNTER — Inpatient Hospital Stay (HOSPITAL_COMMUNITY)
Admission: RE | Admit: 2016-08-14 | Discharge: 2016-08-19 | DRG: 220 | Disposition: A | Discharge status: Home / Self Care | Payer: Managed Care, Other (non HMO) | Source: Ambulatory Visit | Attending: Cardiothoracic Surgery | Admitting: Cardiothoracic Surgery

## 2016-08-14 ENCOUNTER — Encounter (HOSPITAL_COMMUNITY): Payer: Self-pay | Admitting: General Practice

## 2016-08-14 ENCOUNTER — Inpatient Hospital Stay (HOSPITAL_COMMUNITY): Payer: Managed Care, Other (non HMO)

## 2016-08-14 ENCOUNTER — Inpatient Hospital Stay (HOSPITAL_COMMUNITY): Payer: Managed Care, Other (non HMO) | Admitting: Certified Registered Nurse Anesthetist

## 2016-08-14 ENCOUNTER — Encounter (HOSPITAL_COMMUNITY)
Admission: RE | Disposition: A | Discharge status: Home / Self Care | Payer: Self-pay | Source: Ambulatory Visit | Attending: Cardiothoracic Surgery

## 2016-08-14 DIAGNOSIS — Z952 Presence of prosthetic heart valve: Secondary | ICD-10-CM

## 2016-08-14 DIAGNOSIS — I350 Nonrheumatic aortic (valve) stenosis: Secondary | ICD-10-CM | POA: Diagnosis present

## 2016-08-14 DIAGNOSIS — E669 Obesity, unspecified: Secondary | ICD-10-CM | POA: Diagnosis present

## 2016-08-14 DIAGNOSIS — E785 Hyperlipidemia, unspecified: Secondary | ICD-10-CM | POA: Diagnosis present

## 2016-08-14 DIAGNOSIS — I77810 Thoracic aortic ectasia: Secondary | ICD-10-CM | POA: Diagnosis present

## 2016-08-14 DIAGNOSIS — I509 Heart failure, unspecified: Secondary | ICD-10-CM | POA: Diagnosis present

## 2016-08-14 DIAGNOSIS — D62 Acute posthemorrhagic anemia: Secondary | ICD-10-CM | POA: Diagnosis not present

## 2016-08-14 DIAGNOSIS — Z419 Encounter for procedure for purposes other than remedying health state, unspecified: Secondary | ICD-10-CM

## 2016-08-14 DIAGNOSIS — Z6838 Body mass index (BMI) 38.0-38.9, adult: Secondary | ICD-10-CM

## 2016-08-14 DIAGNOSIS — Z87891 Personal history of nicotine dependence: Secondary | ICD-10-CM

## 2016-08-14 DIAGNOSIS — I110 Hypertensive heart disease with heart failure: Secondary | ICD-10-CM | POA: Diagnosis present

## 2016-08-14 DIAGNOSIS — I352 Nonrheumatic aortic (valve) stenosis with insufficiency: Secondary | ICD-10-CM | POA: Diagnosis present

## 2016-08-14 DIAGNOSIS — E119 Type 2 diabetes mellitus without complications: Secondary | ICD-10-CM | POA: Diagnosis present

## 2016-08-14 HISTORY — PX: TEE WITHOUT CARDIOVERSION: SHX5443

## 2016-08-14 HISTORY — PX: AORTIC VALVE REPLACEMENT: SHX41

## 2016-08-14 LAB — POCT I-STAT 3, ART BLOOD GAS (G3+)
Acid-base deficit: 1 mmol/L (ref 0.0–2.0)
Acid-base deficit: 2 mmol/L (ref 0.0–2.0)
Acid-base deficit: 3 mmol/L — ABNORMAL HIGH (ref 0.0–2.0)
Acid-base deficit: 3 mmol/L — ABNORMAL HIGH (ref 0.0–2.0)
Acid-base deficit: 4 mmol/L — ABNORMAL HIGH (ref 0.0–2.0)
Bicarbonate: 25.4 mmol/L (ref 20.0–28.0)
Bicarbonate: 24.5 mmol/L (ref 20.0–28.0)
Bicarbonate: 24.4 mmol/L (ref 20.0–28.0)
Bicarbonate: 24.1 mmol/L (ref 20.0–28.0)
Bicarbonate: 22.5 mmol/L (ref 20.0–28.0)
O2 Saturation: 100 %
O2 Saturation: 100 %
O2 Saturation: 97 %
O2 Saturation: 97 %
O2 Saturation: 93 %
TCO2: 27 mmol/L (ref 0–100)
TCO2: 26 mmol/L (ref 0–100)
TCO2: 26 mmol/L (ref 0–100)
TCO2: 26 mmol/L (ref 0–100)
TCO2: 24 mmol/L (ref 0–100)
pCO2 arterial: 51.3 mmHg — ABNORMAL HIGH (ref 32.0–48.0)
pCO2 arterial: 46.2 mmHg (ref 32.0–48.0)
pCO2 arterial: 51.8 mmHg — ABNORMAL HIGH (ref 32.0–48.0)
pCO2 arterial: 48.2 mmHg — ABNORMAL HIGH (ref 32.0–48.0)
pCO2 arterial: 48.6 mmHg — ABNORMAL HIGH (ref 32.0–48.0)
pH, Arterial: 7.303 — ABNORMAL LOW (ref 7.350–7.450)
pH, Arterial: 7.332 — ABNORMAL LOW (ref 7.350–7.450)
pH, Arterial: 7.281 — ABNORMAL LOW (ref 7.350–7.450)
pH, Arterial: 7.308 — ABNORMAL LOW (ref 7.350–7.450)
pH, Arterial: 7.274 — ABNORMAL LOW (ref 7.350–7.450)
pO2, Arterial: 212 mmHg — ABNORMAL HIGH (ref 83.0–108.0)
pO2, Arterial: 187 mmHg — ABNORMAL HIGH (ref 83.0–108.0)
pO2, Arterial: 99 mmHg (ref 83.0–108.0)
pO2, Arterial: 106 mmHg (ref 83.0–108.0)
pO2, Arterial: 78 mmHg — ABNORMAL LOW (ref 83.0–108.0)

## 2016-08-14 LAB — CBC
HCT: 39.9 % (ref 39.0–52.0)
HCT: 39.1 % (ref 39.0–52.0)
Hemoglobin: 12.9 g/dL — ABNORMAL LOW (ref 13.0–17.0)
Hemoglobin: 12.5 g/dL — ABNORMAL LOW (ref 13.0–17.0)
MCH: 28.9 pg (ref 26.0–34.0)
MCH: 28.7 pg (ref 26.0–34.0)
MCHC: 32.3 g/dL (ref 30.0–36.0)
MCHC: 32 g/dL (ref 30.0–36.0)
MCV: 89.3 fL (ref 78.0–100.0)
MCV: 89.9 fL (ref 78.0–100.0)
PLATELETS: 164 10*3/uL (ref 150–400)
Platelets: 189 10*3/uL (ref 150–400)
RBC: 4.47 MIL/uL (ref 4.22–5.81)
RBC: 4.35 MIL/uL (ref 4.22–5.81)
RDW: 14 % (ref 11.5–15.5)
RDW: 14.1 % (ref 11.5–15.5)
WBC: 14.7 10*3/uL — ABNORMAL HIGH (ref 4.0–10.5)
WBC: 15.7 10*3/uL — ABNORMAL HIGH (ref 4.0–10.5)

## 2016-08-14 LAB — POCT I-STAT, CHEM 8
BUN: 13 mg/dL (ref 6–20)
BUN: 14 mg/dL (ref 6–20)
BUN: 15 mg/dL (ref 6–20)
BUN: 14 mg/dL (ref 6–20)
BUN: 13 mg/dL (ref 6–20)
BUN: 14 mg/dL (ref 6–20)
BUN: 12 mg/dL (ref 6–20)
Calcium, Ion: 1.02 mmol/L — ABNORMAL LOW (ref 1.15–1.40)
Calcium, Ion: 1.21 mmol/L (ref 1.15–1.40)
Calcium, Ion: 1.34 mmol/L (ref 1.15–1.40)
Calcium, Ion: 1.03 mmol/L — ABNORMAL LOW (ref 1.15–1.40)
Calcium, Ion: 1.01 mmol/L — ABNORMAL LOW (ref 1.15–1.40)
Calcium, Ion: 1.18 mmol/L (ref 1.15–1.40)
Calcium, Ion: 1.24 mmol/L (ref 1.15–1.40)
Chloride: 103 mmol/L (ref 101–111)
Chloride: 104 mmol/L (ref 101–111)
Chloride: 99 mmol/L — ABNORMAL LOW (ref 101–111)
Chloride: 101 mmol/L (ref 101–111)
Chloride: 102 mmol/L (ref 101–111)
Chloride: 103 mmol/L (ref 101–111)
Chloride: 107 mmol/L (ref 101–111)
Creatinine, Ser: 0.9 mg/dL (ref 0.61–1.24)
Creatinine, Ser: 1 mg/dL (ref 0.61–1.24)
Creatinine, Ser: 1 mg/dL (ref 0.61–1.24)
Creatinine, Ser: 1 mg/dL (ref 0.61–1.24)
Creatinine, Ser: 0.9 mg/dL (ref 0.61–1.24)
Creatinine, Ser: 0.9 mg/dL (ref 0.61–1.24)
Creatinine, Ser: 1.1 mg/dL (ref 0.61–1.24)
Glucose, Bld: 124 mg/dL — ABNORMAL HIGH (ref 65–99)
Glucose, Bld: 128 mg/dL — ABNORMAL HIGH (ref 65–99)
Glucose, Bld: 149 mg/dL — ABNORMAL HIGH (ref 65–99)
Glucose, Bld: 140 mg/dL — ABNORMAL HIGH (ref 65–99)
Glucose, Bld: 127 mg/dL — ABNORMAL HIGH (ref 65–99)
Glucose, Bld: 143 mg/dL — ABNORMAL HIGH (ref 65–99)
Glucose, Bld: 120 mg/dL — ABNORMAL HIGH (ref 65–99)
HCT: 32 % — ABNORMAL LOW (ref 39.0–52.0)
HCT: 41 % (ref 39.0–52.0)
HCT: 42 % (ref 39.0–52.0)
HCT: 34 % — ABNORMAL LOW (ref 39.0–52.0)
HCT: 31 % — ABNORMAL LOW (ref 39.0–52.0)
HCT: 33 % — ABNORMAL LOW (ref 39.0–52.0)
HCT: 36 % — ABNORMAL LOW (ref 39.0–52.0)
Hemoglobin: 10.9 g/dL — ABNORMAL LOW (ref 13.0–17.0)
Hemoglobin: 13.9 g/dL (ref 13.0–17.0)
Hemoglobin: 14.3 g/dL (ref 13.0–17.0)
Hemoglobin: 11.6 g/dL — ABNORMAL LOW (ref 13.0–17.0)
Hemoglobin: 10.5 g/dL — ABNORMAL LOW (ref 13.0–17.0)
Hemoglobin: 11.2 g/dL — ABNORMAL LOW (ref 13.0–17.0)
Hemoglobin: 12.2 g/dL — ABNORMAL LOW (ref 13.0–17.0)
Potassium: 4.4 mmol/L (ref 3.5–5.1)
Potassium: 4.6 mmol/L (ref 3.5–5.1)
Potassium: 4.6 mmol/L (ref 3.5–5.1)
Potassium: 6.6 mmol/L (ref 3.5–5.1)
Potassium: 5.6 mmol/L — ABNORMAL HIGH (ref 3.5–5.1)
Potassium: 6.8 mmol/L (ref 3.5–5.1)
Potassium: 4.6 mmol/L (ref 3.5–5.1)
Sodium: 138 mmol/L (ref 135–145)
Sodium: 140 mmol/L (ref 135–145)
Sodium: 140 mmol/L (ref 135–145)
Sodium: 137 mmol/L (ref 135–145)
Sodium: 136 mmol/L (ref 135–145)
Sodium: 138 mmol/L (ref 135–145)
Sodium: 137 mmol/L (ref 135–145)
TCO2: 26 mmol/L (ref 0–100)
TCO2: 26 mmol/L (ref 0–100)
TCO2: 27 mmol/L (ref 0–100)
TCO2: 25 mmol/L (ref 0–100)
TCO2: 25 mmol/L (ref 0–100)
TCO2: 26 mmol/L (ref 0–100)
TCO2: 23 mmol/L (ref 0–100)

## 2016-08-14 LAB — MAGNESIUM: Magnesium: 3.1 mg/dL — ABNORMAL HIGH (ref 1.7–2.4)

## 2016-08-14 LAB — GLUCOSE, CAPILLARY
Glucose-Capillary: 129 mg/dL — ABNORMAL HIGH (ref 65–99)
Glucose-Capillary: 127 mg/dL — ABNORMAL HIGH (ref 65–99)
Glucose-Capillary: 127 mg/dL — ABNORMAL HIGH (ref 65–99)
Glucose-Capillary: 115 mg/dL — ABNORMAL HIGH (ref 65–99)
Glucose-Capillary: 124 mg/dL — ABNORMAL HIGH (ref 65–99)
Glucose-Capillary: 136 mg/dL — ABNORMAL HIGH (ref 65–99)
Glucose-Capillary: 132 mg/dL — ABNORMAL HIGH (ref 65–99)
Glucose-Capillary: 111 mg/dL — ABNORMAL HIGH (ref 65–99)
Glucose-Capillary: 113 mg/dL — ABNORMAL HIGH (ref 65–99)

## 2016-08-14 LAB — BASIC METABOLIC PANEL
Anion gap: 5 (ref 5–15)
BUN: 11 mg/dL (ref 6–20)
CO2: 23 mmol/L (ref 22–32)
Calcium: 8.2 mg/dL — ABNORMAL LOW (ref 8.9–10.3)
Chloride: 108 mmol/L (ref 101–111)
Creatinine, Ser: 1.16 mg/dL (ref 0.61–1.24)
GFR calc Af Amer: >60 mL/min (ref >60)
GFR calc non Af Amer: >60 mL/min (ref >60)
Glucose, Bld: 123 mg/dL — ABNORMAL HIGH (ref 65–99)
Potassium: 4.8 mmol/L (ref 3.5–5.1)
Sodium: 136 mmol/L (ref 135–145)

## 2016-08-14 LAB — HEMOGLOBIN AND HEMATOCRIT, BLOOD
HCT: 34.9 % — ABNORMAL LOW (ref 39.0–52.0)
Hemoglobin: 11.4 g/dL — ABNORMAL LOW (ref 13.0–17.0)

## 2016-08-14 LAB — POCT I-STAT 4, (NA,K, GLUC, HGB,HCT)
Glucose, Bld: 129 mg/dL — ABNORMAL HIGH (ref 65–99)
HCT: 38 % — ABNORMAL LOW (ref 39.0–52.0)
Hemoglobin: 12.9 g/dL — ABNORMAL LOW (ref 13.0–17.0)
Potassium: 4.3 mmol/L (ref 3.5–5.1)
Sodium: 142 mmol/L (ref 135–145)

## 2016-08-14 LAB — PLATELET COUNT: Platelets: 189 10*3/uL (ref 150–400)

## 2016-08-14 LAB — PROTIME-INR
INR: 1.4
Prothrombin Time: 17.3 seconds — ABNORMAL HIGH (ref 11.4–15.2)

## 2016-08-14 LAB — APTT: APTT: 30 s (ref 24–36)

## 2016-08-14 SURGERY — REPLACEMENT, AORTIC VALVE, OPEN
Anesthesia: General | Site: Chest

## 2016-08-14 MED ORDER — MORPHINE SULFATE (PF) 2 MG/ML IV SOLN
2.0000 mg | INTRAVENOUS | Status: DC | PRN
  Administered 2016-08-14: 2 mg via INTRAVENOUS
  Administered 2016-08-15: 4 mg via INTRAVENOUS
  Administered 2016-08-16: 2 mg via INTRAVENOUS
  Filled 2016-08-14: qty 1
  Filled 2016-08-14: qty 2
  Filled 2016-08-14: qty 1

## 2016-08-14 MED ORDER — BISACODYL 10 MG RE SUPP: 10.0000 mg | Freq: Every day | RECTAL | Status: DC

## 2016-08-14 MED ORDER — PROTAMINE SULFATE 10 MG/ML IV SOLN
INTRAVENOUS | Status: AC
  Filled 2016-08-14: qty 25

## 2016-08-14 MED ORDER — TRAMADOL HCL 50 MG PO TABS: 50.0000 mg | ORAL_TABLET | ORAL | Status: DC | PRN

## 2016-08-14 MED ORDER — ASPIRIN EC 325 MG PO TBEC
325.0000 mg | DELAYED_RELEASE_TABLET | Freq: Every day | ORAL | Status: DC
  Administered 2016-08-15 – 2016-08-18 (×4): 325 mg via ORAL
  Filled 2016-08-14 (×5): qty 1

## 2016-08-14 MED ORDER — MORPHINE SULFATE (PF) 2 MG/ML IV SOLN: 1.0000 mg | INTRAVENOUS | Status: DC | PRN

## 2016-08-14 MED ORDER — MAGNESIUM SULFATE 4 GM/100ML IV SOLN
4.0000 g | Freq: Once | INTRAVENOUS | Status: AC
  Administered 2016-08-14: 4 g via INTRAVENOUS
  Filled 2016-08-14: qty 100

## 2016-08-14 MED ORDER — ROCURONIUM BROMIDE 10 MG/ML (PF) SYRINGE
PREFILLED_SYRINGE | INTRAVENOUS | Status: AC
Start: 2016-08-14 — End: 2016-08-14
  Filled 2016-08-14: qty 10

## 2016-08-14 MED ORDER — CHLORHEXIDINE GLUCONATE 0.12 % MT SOLN
15.0000 mL | Freq: Two times a day (BID) | OROMUCOSAL | Status: DC
  Administered 2016-08-14: 15 mL via OROMUCOSAL
  Filled 2016-08-14: qty 15

## 2016-08-14 MED ORDER — HEMOSTATIC AGENTS (NO CHARGE) OPTIME
TOPICAL | Status: DC | PRN
  Administered 2016-08-14: 1 via TOPICAL

## 2016-08-14 MED ORDER — LIDOCAINE 2% (20 MG/ML) 5 ML SYRINGE
INTRAMUSCULAR | Status: AC
  Filled 2016-08-14: qty 5

## 2016-08-14 MED ORDER — SODIUM CHLORIDE 0.9 % IJ SOLN
INTRAMUSCULAR | Status: AC
Start: 2016-08-14 — End: 2016-08-14
  Filled 2016-08-14: qty 20

## 2016-08-14 MED ORDER — ALBUMIN HUMAN 5 % IV SOLN
250.0000 mL | INTRAVENOUS | Status: AC | PRN
  Administered 2016-08-14 (×2): 250 mL via INTRAVENOUS

## 2016-08-14 MED ORDER — PROTAMINE SULFATE 10 MG/ML IV SOLN
INTRAVENOUS | Status: DC | PRN
  Administered 2016-08-14: 250 mg via INTRAVENOUS

## 2016-08-14 MED ORDER — LACTATED RINGERS IV SOLN
500.0000 mL | Freq: Once | INTRAVENOUS | Status: DC | PRN
Start: 2016-08-14 — End: 2016-08-15

## 2016-08-14 MED ORDER — SODIUM CHLORIDE 0.45 % IV SOLN
INTRAVENOUS | Status: DC | PRN
  Administered 2016-08-14: 13:00:00 via INTRAVENOUS

## 2016-08-14 MED ORDER — MIDAZOLAM HCL 10 MG/2ML IJ SOLN
INTRAMUSCULAR | Status: AC
  Filled 2016-08-14: qty 2

## 2016-08-14 MED ORDER — BISACODYL 5 MG PO TBEC
10.0000 mg | DELAYED_RELEASE_TABLET | Freq: Every day | ORAL | Status: DC
  Administered 2016-08-15 – 2016-08-17 (×3): 10 mg via ORAL
  Filled 2016-08-14 (×4): qty 2

## 2016-08-14 MED ORDER — SUCCINYLCHOLINE CHLORIDE 200 MG/10ML IV SOSY
PREFILLED_SYRINGE | INTRAVENOUS | Status: DC | PRN
  Administered 2016-08-14: 120 mg via INTRAVENOUS

## 2016-08-14 MED ORDER — FENTANYL CITRATE (PF) 250 MCG/5ML IJ SOLN
INTRAMUSCULAR | Status: AC
  Filled 2016-08-14: qty 25

## 2016-08-14 MED ORDER — PROPOFOL 10 MG/ML IV BOLUS
INTRAVENOUS | Status: DC | PRN
  Administered 2016-08-14: 30 mg via INTRAVENOUS

## 2016-08-14 MED ORDER — NITROGLYCERIN IN D5W 200-5 MCG/ML-% IV SOLN: 0.0000 ug/min | INTRAVENOUS | Status: DC

## 2016-08-14 MED ORDER — CHLORHEXIDINE GLUCONATE 0.12 % MT SOLN
15.0000 mL | OROMUCOSAL | Status: AC
  Administered 2016-08-14: 15 mL via OROMUCOSAL

## 2016-08-14 MED ORDER — SODIUM CHLORIDE 0.9 % IV SOLN: INTRAVENOUS | Status: DC

## 2016-08-14 MED ORDER — THROMBIN 5000 UNITS EX SOLR
CUTANEOUS | Status: DC | PRN
Start: 2016-08-14 — End: 2016-08-14
  Administered 2016-08-14: 5000 [IU] via TOPICAL

## 2016-08-14 MED ORDER — MIDAZOLAM HCL 2 MG/2ML IJ SOLN: 2.0000 mg | INTRAMUSCULAR | Status: DC | PRN

## 2016-08-14 MED ORDER — ALBUMIN HUMAN 5 % IV SOLN
INTRAVENOUS | Status: DC | PRN
  Administered 2016-08-14 (×3): via INTRAVENOUS

## 2016-08-14 MED ORDER — LIDOCAINE 2% (20 MG/ML) 5 ML SYRINGE
INTRAMUSCULAR | Status: DC | PRN
  Administered 2016-08-14: 100 mg via INTRAVENOUS

## 2016-08-14 MED ORDER — VANCOMYCIN HCL IN DEXTROSE 1-5 GM/200ML-% IV SOLN
1000.0000 mg | Freq: Once | INTRAVENOUS | Status: AC
  Administered 2016-08-14: 1000 mg via INTRAVENOUS
  Filled 2016-08-14: qty 200

## 2016-08-14 MED ORDER — METOPROLOL TARTRATE 25 MG/10 ML ORAL SUSPENSION: 12.5000 mg | Freq: Two times a day (BID) | ORAL | Status: DC

## 2016-08-14 MED ORDER — METOPROLOL TARTRATE 12.5 MG HALF TABLET
12.5000 mg | ORAL_TABLET | Freq: Two times a day (BID) | ORAL | Status: DC
  Administered 2016-08-16 – 2016-08-19 (×6): 12.5 mg via ORAL
  Filled 2016-08-14 (×7): qty 1

## 2016-08-14 MED ORDER — DEXTROSE 5 % IV SOLN
1.5000 g | Freq: Two times a day (BID) | INTRAVENOUS | Status: AC
  Administered 2016-08-14 – 2016-08-16 (×4): 1.5 g via INTRAVENOUS
  Filled 2016-08-14 (×4): qty 1.5

## 2016-08-14 MED ORDER — METOPROLOL TARTRATE 5 MG/5ML IV SOLN: 2.5000 mg | INTRAVENOUS | Status: DC | PRN

## 2016-08-14 MED ORDER — OXYCODONE HCL 5 MG PO TABS
5.0000 mg | ORAL_TABLET | ORAL | Status: DC | PRN
  Administered 2016-08-15 – 2016-08-16 (×4): 10 mg via ORAL
  Filled 2016-08-14 (×4): qty 2

## 2016-08-14 MED ORDER — SODIUM CHLORIDE 0.9% FLUSH
3.0000 mL | Freq: Two times a day (BID) | INTRAVENOUS | Status: DC
  Administered 2016-08-15 – 2016-08-16 (×3): 3 mL via INTRAVENOUS

## 2016-08-14 MED ORDER — PROPOFOL 10 MG/ML IV BOLUS
INTRAVENOUS | Status: AC
  Filled 2016-08-14: qty 20

## 2016-08-14 MED ORDER — SODIUM CHLORIDE 0.9 % IV SOLN
INTRAVENOUS | Status: DC | PRN
  Administered 2016-08-14: 11:00:00 via INTRAVENOUS

## 2016-08-14 MED ORDER — LACTATED RINGERS IV SOLN
INTRAVENOUS | Status: DC
  Administered 2016-08-14 (×2): via INTRAVENOUS

## 2016-08-14 MED ORDER — PHENYLEPHRINE HCL 10 MG/ML IJ SOLN
INTRAVENOUS | Status: DC | PRN
  Administered 2016-08-14: 25 ug/min via INTRAVENOUS

## 2016-08-14 MED ORDER — PHENYLEPHRINE HCL 10 MG/ML IJ SOLN
0.0000 ug/min | INTRAVENOUS | Status: DC
  Administered 2016-08-14 (×2): 40 ug/min via INTRAVENOUS
  Filled 2016-08-14 (×2): qty 2

## 2016-08-14 MED ORDER — ROCURONIUM BROMIDE 10 MG/ML (PF) SYRINGE
PREFILLED_SYRINGE | INTRAVENOUS | Status: DC | PRN
  Administered 2016-08-14 (×2): 50 mg via INTRAVENOUS
  Administered 2016-08-14: 60 mg via INTRAVENOUS
  Administered 2016-08-14: 40 mg via INTRAVENOUS

## 2016-08-14 MED ORDER — ONDANSETRON HCL 4 MG/2ML IJ SOLN: 4.0000 mg | Freq: Four times a day (QID) | INTRAMUSCULAR | Status: DC | PRN

## 2016-08-14 MED ORDER — HEPARIN SODIUM (PORCINE) 1000 UNIT/ML IJ SOLN
INTRAMUSCULAR | Status: DC | PRN
Start: 2016-08-14 — End: 2016-08-14
  Administered 2016-08-14: 27000 [IU] via INTRAVENOUS

## 2016-08-14 MED ORDER — INSULIN REGULAR BOLUS VIA INFUSION
0.0000 [IU] | Freq: Three times a day (TID) | INTRAVENOUS | Status: DC
  Filled 2016-08-14: qty 10

## 2016-08-14 MED ORDER — ASPIRIN 81 MG PO CHEW: 324.0000 mg | CHEWABLE_TABLET | Freq: Every day | ORAL | Status: DC

## 2016-08-14 MED ORDER — PANTOPRAZOLE SODIUM 40 MG PO TBEC
40.0000 mg | DELAYED_RELEASE_TABLET | Freq: Every day | ORAL | Status: DC
  Administered 2016-08-16 – 2016-08-19 (×4): 40 mg via ORAL
  Filled 2016-08-14 (×4): qty 1

## 2016-08-14 MED ORDER — HEMOSTATIC AGENTS (NO CHARGE) OPTIME
TOPICAL | Status: DC | PRN
  Administered 2016-08-14 (×2): 1 via TOPICAL

## 2016-08-14 MED ORDER — DOCUSATE SODIUM 100 MG PO CAPS
200.0000 mg | ORAL_CAPSULE | Freq: Every day | ORAL | Status: DC
  Administered 2016-08-15 – 2016-08-19 (×4): 200 mg via ORAL
  Filled 2016-08-14 (×5): qty 2

## 2016-08-14 MED ORDER — SODIUM CHLORIDE 0.9 % IV SOLN
INTRAVENOUS | Status: DC
  Administered 2016-08-14: 1.4 [IU]/h via INTRAVENOUS
  Filled 2016-08-14 (×2): qty 2.5

## 2016-08-14 MED ORDER — PHENYLEPHRINE 40 MCG/ML (10ML) SYRINGE FOR IV PUSH (FOR BLOOD PRESSURE SUPPORT)
PREFILLED_SYRINGE | INTRAVENOUS | Status: DC | PRN
  Administered 2016-08-14 (×2): 80 ug via INTRAVENOUS

## 2016-08-14 MED ORDER — LACTATED RINGERS IV SOLN
INTRAVENOUS | Status: DC | PRN
  Administered 2016-08-14: 07:00:00 via INTRAVENOUS

## 2016-08-14 MED ORDER — FENTANYL CITRATE (PF) 250 MCG/5ML IJ SOLN
INTRAMUSCULAR | Status: DC | PRN
  Administered 2016-08-14: 100 ug via INTRAVENOUS
  Administered 2016-08-14: 150 ug via INTRAVENOUS
  Administered 2016-08-14: 100 ug via INTRAVENOUS
  Administered 2016-08-14: 200 ug via INTRAVENOUS
  Administered 2016-08-14 (×2): 100 ug via INTRAVENOUS
  Administered 2016-08-14: 250 ug via INTRAVENOUS
  Administered 2016-08-14: 100 ug via INTRAVENOUS

## 2016-08-14 MED ORDER — ACETAMINOPHEN 160 MG/5ML PO SOLN: 1000.0000 mg | Freq: Four times a day (QID) | ORAL | Status: DC

## 2016-08-14 MED ORDER — SODIUM CHLORIDE 0.9 % IJ SOLN
OROMUCOSAL | Status: DC | PRN
  Administered 2016-08-14 (×3): 4 mL via TOPICAL

## 2016-08-14 MED ORDER — SODIUM CHLORIDE 0.9 % IR SOLN
Status: DC | PRN
  Administered 2016-08-14: 6000 mL
  Administered 2016-08-14: 1000 mL

## 2016-08-14 MED ORDER — MIDAZOLAM HCL 5 MG/5ML IJ SOLN
INTRAMUSCULAR | Status: DC | PRN
  Administered 2016-08-14 (×2): 2 mg via INTRAVENOUS
  Administered 2016-08-14: 6 mg via INTRAVENOUS

## 2016-08-14 MED ORDER — CALCIUM CHLORIDE 10 % IV SOLN
INTRAVENOUS | Status: DC | PRN
  Administered 2016-08-14: 400 mg via INTRAVENOUS
  Administered 2016-08-14: 350 mg via INTRAVENOUS

## 2016-08-14 MED ORDER — SODIUM CHLORIDE 0.9 % IV SOLN
20.0000 ug | INTRAVENOUS | Status: AC
  Administered 2016-08-14: 20 ug via INTRAVENOUS
  Filled 2016-08-14: qty 5

## 2016-08-14 MED ORDER — FAMOTIDINE IN NACL 20-0.9 MG/50ML-% IV SOLN
20.0000 mg | Freq: Two times a day (BID) | INTRAVENOUS | Status: DC
  Administered 2016-08-14: 20 mg via INTRAVENOUS

## 2016-08-14 MED ORDER — ACETAMINOPHEN 500 MG PO TABS
1000.0000 mg | ORAL_TABLET | Freq: Four times a day (QID) | ORAL | Status: DC
  Administered 2016-08-14 – 2016-08-19 (×17): 1000 mg via ORAL
  Filled 2016-08-14 (×15): qty 2

## 2016-08-14 MED ORDER — LACTATED RINGERS IV SOLN
INTRAVENOUS | Status: DC
Start: 2016-08-14 — End: 2016-08-16

## 2016-08-14 MED ORDER — DEXMEDETOMIDINE HCL IN NACL 200 MCG/50ML IV SOLN
0.0000 ug/kg/h | INTRAVENOUS | Status: DC
  Administered 2016-08-14: 0.7 ug/kg/h via INTRAVENOUS
  Filled 2016-08-14 (×2): qty 50

## 2016-08-14 MED ORDER — ACETAMINOPHEN 160 MG/5ML PO SOLN: 650.0000 mg | Freq: Once | ORAL | Status: AC

## 2016-08-14 MED ORDER — ATORVASTATIN CALCIUM 40 MG PO TABS
40.0000 mg | ORAL_TABLET | Freq: Every day | ORAL | Status: DC
Start: 2016-08-14 — End: 2016-08-19
  Administered 2016-08-15 – 2016-08-18 (×4): 40 mg via ORAL
  Filled 2016-08-14 (×4): qty 1

## 2016-08-14 MED ORDER — ACETAMINOPHEN 650 MG RE SUPP
650.0000 mg | Freq: Once | RECTAL | Status: AC
  Administered 2016-08-14: 650 mg via RECTAL

## 2016-08-14 MED ORDER — SODIUM CHLORIDE 0.9 % IV SOLN
250.0000 mL | INTRAVENOUS | Status: DC
  Administered 2016-08-15: 250 mL via INTRAVENOUS

## 2016-08-14 MED ORDER — SODIUM CHLORIDE 0.9% FLUSH: 3.0000 mL | INTRAVENOUS | Status: DC | PRN

## 2016-08-14 MED ORDER — BIOTENE DRY MOUTH MT LIQD
15.0000 mL | Freq: Four times a day (QID) | OROMUCOSAL | Status: DC
  Administered 2016-08-14 – 2016-08-15 (×3): 15 mL via OROMUCOSAL

## 2016-08-14 MED ORDER — THROMBIN 5000 UNITS EX SOLR
CUTANEOUS | Status: AC
  Filled 2016-08-14: qty 5000

## 2016-08-14 MED ORDER — ROCURONIUM BROMIDE 100 MG/10ML IV SOLN: INTRAVENOUS | Status: DC | PRN

## 2016-08-14 MED ORDER — POTASSIUM CHLORIDE 10 MEQ/50ML IV SOLN: 10.0000 meq | INTRAVENOUS | Status: AC

## 2016-08-14 MED ORDER — HEPARIN SODIUM (PORCINE) 1000 UNIT/ML IJ SOLN
INTRAMUSCULAR | Status: AC
Start: 2016-08-14 — End: 2016-08-14
  Filled 2016-08-14: qty 1

## 2016-08-14 MED FILL — Electrolyte-R (PH 7.4) Solution: INTRAVENOUS | Qty: 6000 | Status: AC

## 2016-08-14 MED FILL — Heparin Sodium (Porcine) Inj 1000 Unit/ML: INTRAMUSCULAR | Qty: 20 | Status: AC

## 2016-08-14 MED FILL — Mannitol IV Soln 20%: INTRAVENOUS | Qty: 500 | Status: AC

## 2016-08-14 MED FILL — Sodium Bicarbonate IV Soln 8.4%: INTRAVENOUS | Qty: 50 | Status: AC

## 2016-08-14 MED FILL — Sodium Chloride IV Soln 0.9%: INTRAVENOUS | Qty: 2000 | Status: AC

## 2016-08-14 MED FILL — Heparin Sodium (Porcine) Inj 1000 Unit/ML: INTRAMUSCULAR | Qty: 30 | Status: AC

## 2016-08-14 MED FILL — Potassium Chloride Inj 2 mEq/ML: INTRAVENOUS | Qty: 40 | Status: AC

## 2016-08-14 MED FILL — Lidocaine HCl IV Inj 20 MG/ML: INTRAVENOUS | Qty: 5 | Status: AC

## 2016-08-14 MED FILL — Magnesium Sulfate Inj 50%: INTRAMUSCULAR | Qty: 10 | Status: AC

## 2016-08-14 SURGICAL SUPPLY — 82 items
ADAPTER CARDIO PERF ANTE/RETRO (ADAPTER) ×4 IMPLANT
APPLICATOR COTTON TIP 6IN STRL (MISCELLANEOUS) ×4 IMPLANT
BAG DECANTER FOR FLEXI CONT (MISCELLANEOUS) ×4 IMPLANT
BLADE STERNUM SYSTEM 6 (BLADE) ×4 IMPLANT
BLADE SURG 11 STRL SS (BLADE) ×4 IMPLANT
BLADE SURG 12 STRL SS (BLADE) ×4 IMPLANT
BLADE SURG 15 STRL LF DISP TIS (BLADE) ×2 IMPLANT
BLADE SURG 15 STRL SS (BLADE) ×2
CANISTER SUCTION 2500CC (MISCELLANEOUS) ×4 IMPLANT
CANNULA GUNDRY RCSP 15FR (MISCELLANEOUS) ×4 IMPLANT
CATH CPB KIT VANTRIGT (MISCELLANEOUS) ×4 IMPLANT
CATH HEART VENT LEFT (CATHETERS) ×4 IMPLANT
CATH RETROPLEGIA CORONARY 14FR (CATHETERS) ×4 IMPLANT
CATH ROBINSON RED A/P 18FR (CATHETERS) ×12 IMPLANT
CATH THORACIC 36FR RT ANG (CATHETERS) ×4 IMPLANT
CLIP FOGARTY SPRING 6M (CLIP) IMPLANT
COVER SURGICAL LIGHT HANDLE (MISCELLANEOUS) ×8 IMPLANT
CRADLE DONUT ADULT HEAD (MISCELLANEOUS) ×4 IMPLANT
DRAIN CHANNEL 32F RND 10.7 FF (WOUND CARE) ×4 IMPLANT
DRAPE CARDIOVASCULAR INCISE (DRAPES) ×2
DRAPE SLUSH/WARMER DISC (DRAPES) ×4 IMPLANT
DRAPE SRG 135X102X78XABS (DRAPES) ×2 IMPLANT
DRSG AQUACEL AG ADV 3.5X14 (GAUZE/BANDAGES/DRESSINGS) ×4 IMPLANT
ELECT BLADE 4.0 EZ CLEAN MEGAD (MISCELLANEOUS) ×4
ELECT BLADE 6.5 EXT (BLADE) ×4 IMPLANT
ELECT CAUTERY BLADE 6.4 (BLADE) ×4 IMPLANT
ELECT REM PT RETURN 9FT ADLT (ELECTROSURGICAL) ×8
ELECTRODE BLDE 4.0 EZ CLN MEGD (MISCELLANEOUS) ×2 IMPLANT
ELECTRODE REM PT RTRN 9FT ADLT (ELECTROSURGICAL) ×4 IMPLANT
FELT TEFLON 1X6 (MISCELLANEOUS) ×4 IMPLANT
GAUZE SPONGE 4X4 12PLY STRL (GAUZE/BANDAGES/DRESSINGS) ×8 IMPLANT
GLOVE BIO SURGEON STRL SZ7.5 (GLOVE) ×8 IMPLANT
GOWN STRL REUS W/ TWL LRG LVL3 (GOWN DISPOSABLE) ×8 IMPLANT
GOWN STRL REUS W/TWL LRG LVL3 (GOWN DISPOSABLE) ×8
HEMOSTAT POWDER SURGIFOAM 1G (HEMOSTASIS) ×12 IMPLANT
HEMOSTAT SURGICEL 2X14 (HEMOSTASIS) ×4 IMPLANT
INSERT FOGARTY XLG (MISCELLANEOUS) IMPLANT
KIT BASIN OR (CUSTOM PROCEDURE TRAY) ×4 IMPLANT
KIT ROOM TURNOVER OR (KITS) ×4 IMPLANT
KIT SUCTION CATH 14FR (SUCTIONS) ×4 IMPLANT
LEAD PACING MYOCARDI (MISCELLANEOUS) ×4 IMPLANT
LINE VENT (MISCELLANEOUS) ×4 IMPLANT
MATRIX HEMOSTAT SURGIFLO (HEMOSTASIS) ×8 IMPLANT
NS IRRIG 1000ML POUR BTL (IV SOLUTION) ×28 IMPLANT
PACK OPEN HEART (CUSTOM PROCEDURE TRAY) ×4 IMPLANT
PAD ARMBOARD 7.5X6 YLW CONV (MISCELLANEOUS) ×8 IMPLANT
SET CARDIOPLEGIA MPS 5001102 (MISCELLANEOUS) ×4 IMPLANT
SPOGE SURGIFLO 8M (HEMOSTASIS)
SPONGE GAUZE 4X4 12PLY STER LF (GAUZE/BANDAGES/DRESSINGS) ×8 IMPLANT
SPONGE SURGIFLO 8M (HEMOSTASIS) IMPLANT
SURGIFLO W/THROMBIN 8M KIT (HEMOSTASIS) ×4 IMPLANT
SUT BONE WAX W31G (SUTURE) ×4 IMPLANT
SUT ETHIBON 2 0 V 52N 30 (SUTURE) ×8 IMPLANT
SUT ETHIBON EXCEL 2-0 V-5 (SUTURE) ×4 IMPLANT
SUT ETHIBOND 2 0 SH (SUTURE) ×2
SUT ETHIBOND 2 0 SH 36X2 (SUTURE) ×2 IMPLANT
SUT PROLENE 3 0 RB 1 (SUTURE) ×8 IMPLANT
SUT PROLENE 3 0 SH 1 (SUTURE) IMPLANT
SUT PROLENE 3 0 SH DA (SUTURE) ×4 IMPLANT
SUT PROLENE 4 0 RB 1 (SUTURE) ×14
SUT PROLENE 4 0 SH DA (SUTURE) ×16 IMPLANT
SUT PROLENE 4-0 RB1 .5 CRCL 36 (SUTURE) ×14 IMPLANT
SUT PROLENE 6 0 C 1 30 (SUTURE) ×4 IMPLANT
SUT SILK 2 0 SH CR/8 (SUTURE) ×4 IMPLANT
SUT STEEL 6MS V (SUTURE) ×8 IMPLANT
SUT STEEL SZ 6 DBL 3X14 BALL (SUTURE) ×4 IMPLANT
SUT VIC AB 1 CTX 36 (SUTURE) ×4
SUT VIC AB 1 CTX36XBRD ANBCTR (SUTURE) ×4 IMPLANT
SUT VIC AB 2-0 CTX 27 (SUTURE) IMPLANT
SUT VIC AB 3-0 X1 27 (SUTURE) IMPLANT
SYR 10ML KIT SKIN ADHESIVE (MISCELLANEOUS) IMPLANT
SYSTEM SAHARA CHEST DRAIN ATS (WOUND CARE) ×4 IMPLANT
TAPE CLOTH SURG 4X10 WHT LF (GAUZE/BANDAGES/DRESSINGS) ×4 IMPLANT
TAPE PAPER 3X10 WHT MICROPORE (GAUZE/BANDAGES/DRESSINGS) ×4 IMPLANT
TOWEL OR 17X24 6PK STRL BLUE (TOWEL DISPOSABLE) ×8 IMPLANT
TOWEL OR 17X26 10 PK STRL BLUE (TOWEL DISPOSABLE) ×8 IMPLANT
TRAY FOLEY IC TEMP SENS 16FR (CATHETERS) ×4 IMPLANT
UNDERPAD 30X30 (UNDERPADS AND DIAPERS) ×4 IMPLANT
VALVE AORTIC TOP HAT (Prosthesis & Implant Heart) ×2 IMPLANT
VALVE AORTIC TOP HAT 25 (Prosthesis & Implant Heart) ×2 IMPLANT
VENT LEFT HEART 12002 (CATHETERS) ×8
WATER STERILE IRR 1000ML POUR (IV SOLUTION) ×8 IMPLANT

## 2016-08-14 NOTE — Transfer of Care (Signed)
Immediate Anesthesia Transfer of Care Note  Patient: [name] [name]  Procedure(s) Performed: Procedure(s): AORTIC VALVE REPLACEMENT (AVR) USING 25MM CARBOMEDIC MECHANICAL VALVE (N/A) TRANSESOPHAGEAL ECHOCARDIOGRAM (TEE) (N/A)  Patient Location: SICU  Anesthesia Type:General  Level of Consciousness: sedated and Patient remains intubated per anesthesia plan  Airway & Oxygen Therapy: Patient remains intubated per anesthesia plan and Patient placed on Ventilator (see vital sign flow sheet for setting)  Post-op Assessment: Report given to RN and post op vital signs stable  Post vital signs: Reviewed and stable  Last Vitals:  Vitals:   08/14/16 0602 08/14/16 1245  BP: 110/62 117/66  Pulse: 77 79  Resp: 20 12  Temp: 36.7 C     Last Pain:  Vitals:   08/14/16 0602  TempSrc: Oral      Patients Stated Pain Goal: 2 (08/14/16 0602)  Complications: No apparent anesthesia complications

## 2016-08-14 NOTE — Progress Notes (Signed)
The patient was examined and preop studies reviewed. There has been no change from the prior exam and the patient is ready for surgery.  plan AVR on [address] [place] today

## 2016-08-14 NOTE — Procedures (Signed)
Extubation Procedure Note  Patient Details:   Name: [name] [name] DOB: 21-Nov-1971 MRN: [id number]   Airway Documentation:     Evaluation  O2 sats: stable throughout Complications: No apparent complications Patient did tolerate procedure well. Bilateral Breath Sounds: Clear   Yes  Patient tolerated rapid wean. Positive for cuff leak. Patient extubated to a 4Lpm [place]. No signs of dyspnea or stridor. Patient resting comfortably. RN at bedside.   Ancil BoozerSmallwood, Chauncey Bruno 08/14/2016, 1710

## 2016-08-14 NOTE — Anesthesia Procedure Notes (Signed)
Central Venous Catheter Insertion Performed by: anesthesiologist Patient location: Pre-op. Preanesthetic checklist: patient identified, IV checked, site marked, risks and benefits discussed, surgical consent, monitors and equipment checked, pre-op evaluation, timeout performed and anesthesia consent Landmarks identified Catheter size: 8.5 Fr PA cath was placed.Swan type and PA catheter depth:thermodilationProcedure performed using ultrasound guided technique. Attempts: 1 Following insertion, line sutured, dressing applied and Biopatch. Post procedure assessment: blood return through all ports and free fluid flow. Patient tolerated the procedure well with no immediate complications.

## 2016-08-14 NOTE — Progress Notes (Signed)
[facility] BRIEF SICU PROGRESS NOTE  Day of Surgery  S/P Procedure(s) (LRB): AORTIC VALVE REPLACEMENT (AVR) USING 25MM CARBOMEDIC MECHANICAL VALVE (N/A) TRANSESOPHAGEAL ECHOCARDIOGRAM (TEE) (N/A)   Just extubated, neuro grossly intact NSR w/ stable hemodynamics on Neo drip Breathing comfortably w/ O2 sats 98% Chest tube output low UOP adequate Labs okay  Plan: Continue routine early postop  [name] Nailslarence H Zelia Yzaguirre, MD 08/14/2016 5:22 PM

## 2016-08-14 NOTE — Anesthesia Procedure Notes (Signed)
Procedure Name: Intubation Date/Time: 08/14/2016 7:46 AM Performed by: Annabelle HarmanSMITH, Kebra Lowrimore A Pre-anesthesia Checklist: Patient identified, Emergency Drugs available, Suction available and Patient being monitored Patient Re-evaluated:Patient Re-evaluated prior to inductionOxygen Delivery Method: Circle system utilized Preoxygenation: Pre-oxygenation with 100% oxygen Intubation Type: IV induction Ventilation: Two handed mask ventilation required, Oral airway inserted - appropriate to patient size and Mask ventilation with difficulty Laryngoscope Size: Miller and 2 Grade View: Grade II Tube type: Oral Tube size: 8.0 mm Number of attempts: 1 Airway Equipment and Method: Stylet Placement Confirmation: ETT inserted through vocal cords under direct vision,  positive ETCO2 and breath sounds checked- equal and bilateral Secured at: 24 cm Tube secured with: Tape Dental Injury: Teeth and Oropharynx as per pre-operative assessment

## 2016-08-14 NOTE — Progress Notes (Signed)
  Echocardiogram Echocardiogram Transesophageal has been performed.  Janalyn HarderWest, Yosselin Zoeller R 08/14/2016, 8:11 AM

## 2016-08-14 NOTE — Anesthesia Postprocedure Evaluation (Signed)
Anesthesia Post Note  Patient: [name] [name]  Procedure(s) Performed: Procedure(s) (LRB): AORTIC VALVE REPLACEMENT (AVR) USING 25MM CARBOMEDIC MECHANICAL VALVE (N/A) TRANSESOPHAGEAL ECHOCARDIOGRAM (TEE) (N/A)  Patient location during evaluation: SICU Anesthesia Type: General Level of consciousness: sedated and patient remains intubated per anesthesia plan Pain management: pain level controlled Vital Signs Assessment: post-procedure vital signs reviewed and stable Respiratory status: patient remains intubated per anesthesia plan Cardiovascular status: stable Anesthetic complications: no    Last Vitals:  Vitals:   08/14/16 1515 08/14/16 1530  BP:    Pulse: 72 71  Resp: 13 14  Temp: 36.9 C 36.9 C    Last Pain:  Vitals:   08/14/16 0602  TempSrc: Oral                 Tamkia Temples,JAMES TERRILL

## 2016-08-14 NOTE — Brief Op Note (Signed)
08/14/2016  11:01 AM       [address]       [place] [id number]             [phone]     08/14/2016  11:01 AM  PATIENT:  [name] [name]  45 y.o. male  PRE-OPERATIVE DIAGNOSIS:  SEVERE AS  POST-OPERATIVE DIAGNOSIS:  Same  PROCEDURE:  Procedure(s): AORTIC VALVE REPLACEMENT (AVR) USING 25MM CARBOMEDIC MECHANICAL VALVE TRANSESOPHAGEAL ECHOCARDIOGRAM (TEE)  SURGEON:  Surgeon(s): Kerin PernaPeter Van Trigt, MD  PHYSICIAN ASSISTANT: Ashiah Karpowicz PA-C  ANESTHESIA:   general  PATIENT CONDITION:  ICU - intubated and hemodynamically stable.  PRE-OPERATIVE WEIGHT: 108.2kg  COMPLICATIONS: NO KNOWN

## 2016-08-15 ENCOUNTER — Encounter (HOSPITAL_COMMUNITY): Payer: Self-pay | Admitting: Cardiothoracic Surgery

## 2016-08-15 ENCOUNTER — Inpatient Hospital Stay (HOSPITAL_COMMUNITY): Payer: Managed Care, Other (non HMO)

## 2016-08-15 LAB — CBC
HCT: 37.5 % — ABNORMAL LOW (ref 39.0–52.0)
HCT: 36.5 % — ABNORMAL LOW (ref 39.0–52.0)
Hemoglobin: 11.7 g/dL — ABNORMAL LOW (ref 13.0–17.0)
Hemoglobin: 11.6 g/dL — ABNORMAL LOW (ref 13.0–17.0)
MCH: 28.1 pg (ref 26.0–34.0)
MCH: 28.8 pg (ref 26.0–34.0)
MCHC: 31.2 g/dL (ref 30.0–36.0)
MCHC: 31.8 g/dL (ref 30.0–36.0)
MCV: 89.9 fL (ref 78.0–100.0)
MCV: 90.6 fL (ref 78.0–100.0)
PLATELETS: 151 10*3/uL (ref 150–400)
Platelets: 175 10*3/uL (ref 150–400)
RBC: 4.17 MIL/uL — ABNORMAL LOW (ref 4.22–5.81)
RBC: 4.03 MIL/uL — ABNORMAL LOW (ref 4.22–5.81)
RDW: 14.4 % (ref 11.5–15.5)
RDW: 14.6 % (ref 11.5–15.5)
WBC: 14.6 10*3/uL — ABNORMAL HIGH (ref 4.0–10.5)
WBC: 13.4 10*3/uL — ABNORMAL HIGH (ref 4.0–10.5)

## 2016-08-15 LAB — POCT I-STAT, CHEM 8
BUN: 13 mg/dL (ref 6–20)
Calcium, Ion: 1.24 mmol/L (ref 1.15–1.40)
Chloride: 98 mmol/L — ABNORMAL LOW (ref 101–111)
Creatinine, Ser: 1.3 mg/dL — ABNORMAL HIGH (ref 0.61–1.24)
Glucose, Bld: 129 mg/dL — ABNORMAL HIGH (ref 65–99)
HCT: 36 % — ABNORMAL LOW (ref 39.0–52.0)
Hemoglobin: 12.2 g/dL — ABNORMAL LOW (ref 13.0–17.0)
Potassium: 3.9 mmol/L (ref 3.5–5.1)
Sodium: 139 mmol/L (ref 135–145)
TCO2: 25 mmol/L (ref 0–100)

## 2016-08-15 LAB — GLUCOSE, CAPILLARY
Glucose-Capillary: 103 mg/dL — ABNORMAL HIGH (ref 65–99)
Glucose-Capillary: 108 mg/dL — ABNORMAL HIGH (ref 65–99)
Glucose-Capillary: 109 mg/dL — ABNORMAL HIGH (ref 65–99)
Glucose-Capillary: 110 mg/dL — ABNORMAL HIGH (ref 65–99)
Glucose-Capillary: 111 mg/dL — ABNORMAL HIGH (ref 65–99)
Glucose-Capillary: 111 mg/dL — ABNORMAL HIGH (ref 65–99)
Glucose-Capillary: 113 mg/dL — ABNORMAL HIGH (ref 65–99)
Glucose-Capillary: 115 mg/dL — ABNORMAL HIGH (ref 65–99)
Glucose-Capillary: 116 mg/dL — ABNORMAL HIGH (ref 65–99)
Glucose-Capillary: 118 mg/dL — ABNORMAL HIGH (ref 65–99)
Glucose-Capillary: 120 mg/dL — ABNORMAL HIGH (ref 65–99)
Glucose-Capillary: 129 mg/dL — ABNORMAL HIGH (ref 65–99)
Glucose-Capillary: 133 mg/dL — ABNORMAL HIGH (ref 65–99)

## 2016-08-15 LAB — BASIC METABOLIC PANEL
Anion gap: 8 (ref 5–15)
BUN: 12 mg/dL (ref 6–20)
CO2: 23 mmol/L (ref 22–32)
CREATININE: 1.24 mg/dL (ref 0.61–1.24)
Calcium: 8.4 mg/dL — ABNORMAL LOW (ref 8.9–10.3)
Chloride: 107 mmol/L (ref 101–111)
GFR calc Af Amer: >60 mL/min (ref >60)
GLUCOSE: 119 mg/dL — AB (ref 65–99)
Potassium: 4.5 mmol/L (ref 3.5–5.1)
SODIUM: 138 mmol/L (ref 135–145)

## 2016-08-15 LAB — MAGNESIUM
MAGNESIUM: 2.3 mg/dL (ref 1.7–2.4)
Magnesium: 2 mg/dL (ref 1.7–2.4)

## 2016-08-15 LAB — CREATININE, SERUM
CREATININE: 1.39 mg/dL — AB (ref 0.61–1.24)
GFR calc Af Amer: >60 mL/min (ref >60)
GFR calc non Af Amer: 59 mL/min — ABNORMAL LOW (ref >60)

## 2016-08-15 MED ORDER — KETOROLAC TROMETHAMINE 15 MG/ML IJ SOLN
15.0000 mg | Freq: Four times a day (QID) | INTRAMUSCULAR | Status: AC
  Administered 2016-08-15 – 2016-08-16 (×5): 15 mg via INTRAVENOUS
  Filled 2016-08-15 (×5): qty 1

## 2016-08-15 MED ORDER — WARFARIN - PHYSICIAN DOSING INPATIENT
Freq: Every day | Status: DC
  Administered 2016-08-17 – 2016-08-18 (×2)

## 2016-08-15 MED ORDER — ALLOPURINOL 300 MG PO TABS
300.0000 mg | ORAL_TABLET | Freq: Every day | ORAL | Status: DC
Start: 2016-08-15 — End: 2016-08-19
  Administered 2016-08-15 – 2016-08-19 (×5): 300 mg via ORAL
  Filled 2016-08-15 (×5): qty 1

## 2016-08-15 MED ORDER — VANCOMYCIN HCL IN DEXTROSE 1-5 GM/200ML-% IV SOLN
1000.0000 mg | Freq: Two times a day (BID) | INTRAVENOUS | Status: AC
  Administered 2016-08-15 (×2): 1000 mg via INTRAVENOUS
  Filled 2016-08-15 (×2): qty 200

## 2016-08-15 MED ORDER — INSULIN DETEMIR 100 UNIT/ML ~~LOC~~ SOLN
14.0000 [IU] | Freq: Two times a day (BID) | SUBCUTANEOUS | Status: DC
  Administered 2016-08-15 – 2016-08-17 (×5): 14 [IU] via SUBCUTANEOUS
  Filled 2016-08-15 (×9): qty 0.14

## 2016-08-15 MED ORDER — WARFARIN SODIUM 2.5 MG PO TABS
2.5000 mg | ORAL_TABLET | Freq: Once | ORAL | Status: AC
  Administered 2016-08-15: 2.5 mg via ORAL
  Filled 2016-08-15: qty 1

## 2016-08-15 MED ORDER — FUROSEMIDE 10 MG/ML IJ SOLN
20.0000 mg | Freq: Two times a day (BID) | INTRAMUSCULAR | Status: AC
  Administered 2016-08-15 – 2016-08-16 (×3): 20 mg via INTRAVENOUS
  Filled 2016-08-15 (×3): qty 2

## 2016-08-15 MED ORDER — INSULIN DETEMIR 100 UNIT/ML ~~LOC~~ SOLN: 12.0000 [IU] | Freq: Two times a day (BID) | SUBCUTANEOUS | Status: DC

## 2016-08-15 MED ORDER — INSULIN ASPART 100 UNIT/ML ~~LOC~~ SOLN
0.0000 [IU] | SUBCUTANEOUS | Status: DC
  Administered 2016-08-15 – 2016-08-16 (×4): 2 [IU] via SUBCUTANEOUS

## 2016-08-15 NOTE — Progress Notes (Signed)
CT surgery p.m. Rounds  Incisional pain improve this p.m. Was able to walk in the hallway Atrially paced 80/m P.m. labs satisfactory

## 2016-08-15 NOTE — Progress Notes (Signed)
1 Day Post-Op Procedure(s) (LRB): AORTIC VALVE REPLACEMENT (AVR) USING 25MM CARBOMEDIC MECHANICAL VALVE (N/A) TRANSESOPHAGEAL ECHOCARDIOGRAM (TEE) (N/A) Subjective: Complains of musculo-skeletal pain - add toradol nsr CXR clear Objective: Vital signs in last 24 hours: Temp:  [97.5 F (36.4 C)-100.6 F (38.1 C)] 99.1 F (37.3 C) (08/30 0830) Pulse Rate:  [68-86] 85 (08/30 0830) Resp:  [12-22] 18 (08/30 0830) BP: (75-117)/(46-76) 92/56 (08/30 0800) SpO2:  [94 %-100 %] 97 % (08/30 0830) Arterial Line BP: (95-143)/(51-77) 129/58 (08/30 0830) FiO2 (%):  [40 %-50 %] 40 % (08/29 1630) Weight:  [251 lb 8.7 oz (114.1 kg)] 251 lb 8.7 oz (114.1 kg) (08/30 0544)  Hemodynamic parameters for last 24 hours: PAP: (34-45)/(18-29) 34/20 CO:  [2.9 L/min-11 L/min] 6.9 L/min CI:  [2.2 L/min/m2-5.1 L/min/m2] 3.2 L/min/m2  Intake/Output from previous day: 08/29 0701 - 08/30 0700 In: 5840.7 [I.V.:3150.7; Blood:815; IV Piggyback:1875] Out: 5355 [Urine:3330; Blood:1545; Chest Tube:480] Intake/Output this shift: Total I/O In: 70 [I.V.:20; IV Piggyback:50] Out: 90 [Urine:90]  EXAM  neuro intact nsr avr with sharp closure click  Lab Results:  Recent Labs  08/14/16 1812 08/15/16 0527  WBC 15.7* 14.6*  HGB 12.5* 11.7*  HCT 39.1 37.5*  PLT 189 175   BMET:  Recent Labs  08/14/16 1812 08/15/16 0404  NA 136 138  K 4.8 4.5  CL 108 107  CO2 23 23  GLUCOSE 123* 119*  BUN 11 12  CREATININE 1.16 1.24  CALCIUM 8.2* 8.4*    PT/INR:  Recent Labs  08/14/16 1253  LABPROT 17.3*  INR 1.40   ABG    Component Value Date/Time   PHART 7.274 (L) 08/14/2016 1806   HCO3 22.5 08/14/2016 1806   TCO2 23 08/14/2016 1811   ACIDBASEDEF 4.0 (H) 08/14/2016 1806   O2SAT 93.0 08/14/2016 1806   CBG (last 3)   Recent Labs  08/15/16 0454 08/15/16 0554 08/15/16 0659  GLUCAP 120* 111* 111*    Assessment/Plan: S/P Procedure(s) (LRB): AORTIC VALVE REPLACEMENT (AVR) USING 25MM CARBOMEDIC  MECHANICAL VALVE (N/A) TRANSESOPHAGEAL ECHOCARDIOGRAM (TEE) (N/A) Mobilize Diuresis Diabetes control See progression orders start coumadin low dose for mechanical valve   LOS: 1 day    Kathlee Nationseter Van Trigt III 08/15/2016

## 2016-08-15 NOTE — Op Note (Signed)
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  He is brought to the holding area today for routine placement of pulmonary artery catheter and radial arterial lines.  He is to have an aortic valve replacement to be performed by Dr. Kathlee NationsPeter Van [name].  On arrival to the OR, the patient is routinely induced for general anesthesia.  The TEE probe is prepared and passed oropharyngeally into the stomach then slightly withdrawn for imaging of the cardiac structures.  PRE-CARDIOPULMONARY BYPASS TEE EXAMINATION:  Left ventricle:  The left ventricular chamber is seen initially in the short axis view.  There is mild concentric left ventricular hypertrophy appreciated.  All segmental wall areas are thickened and are contractile and vigorous.  Estimated ejection fraction is greater than 50%.  Papillary muscles are seen and well outlined.  Both long and short axis views are obtained.  Mitral valve:  In a four-chamber view, the mitral valve apparatus is seen.  It appears to be of normal contour for the lead.  The leaflets are thin, compliant, and mobile.  There is neither flail and no prolapse appreciated.  On color Doppler, there is 1+ regurgitant flow noted. Multiple views are carried out again.  Left atrium:  The left atrial chamber is of normal size and shape.  Left atrial appendage is visualized and is clear.  Aortic valve:  The aortic valve is seen initially in the short axis view.  There are good images detailed.  It appears to be functionally bicuspid in appearance and shape.   There is heavy calcium noted in the areas of the right and noncoronary cusps edges and raphe.  These areas are essentially fused.  On planometry, we estimated the aortic valve area of about 1.1 cm2.  Calcium is appreciated also in the aortic root area itself.  On color Doppler in a long-axis view, we see nice turbulent jet of the aortic stenotic area in the ascending aorta.  We also see a fairly significant jet of aortic insufficiency filling the LVOT approximately 50% of the width of the LVOT.  This suggest a moderate stenosis and a mild-to-moderate regurgitation in this diseased aortic valve.  Pressure measurements across the valve are made with peak gradients seen in the area of 51-52 mmHg.  With mean gradients 32-34 measured by transgastric views.  Right ventricle:  Right ventricular chamber is seen in the four-chamber view.  It is vigorous, contractile, especially the free wall.  It is of normal size, shape, and contour.  Tricuspid valve:  Thin, compliant, mobile tricuspid valve.  Right atrium:  Normal right atrial chamber.  A pulmonary artery catheter is noted within.  POST-CARDIOPULMONARY TEE EXAMINATION:  Left ventricle:  The left ventricular chamber is seen in the early post bypass.  Short axis views are obtained.  There is mild left ventricular depression noted.  There is mild septal dyssynergy appreciated in this early bypass.  The ventricle was also paced at that this time.  With time and separation of cardiopulmonary bypass, it improved volume replacement.  The left ventricular chamber resumed its normal and more vigorous contractile pattern and the septal dyssynergy appeared to improve.  The left ventricular is essentially near baseline prior to transfer from the room.  Aortic:  In the aortic position is now seen a mechanical valve.  The edges and leaflets of the valve are well outlined and well seen and well visualized.  Short and long-axis views are obtained.  The  leaflet edges are seen, both opening and closing, satisfactorily during systolic ejection and diastolic inflow.  It appears to be seated well, functioning in an entirely appropriate manner.  The rest of the cardiac examination was as previously described.  The patient is returned to the Cardiac Intensive Care Unit in stable condition.          ______________________________ [name] Forts, M.D.     JTM/MEDQ  D:  08/14/2016  T:  08/15/2016  Job:  [id number]

## 2016-08-16 ENCOUNTER — Inpatient Hospital Stay (HOSPITAL_COMMUNITY): Payer: Managed Care, Other (non HMO)

## 2016-08-16 LAB — BASIC METABOLIC PANEL
Anion gap: 5 (ref 5–15)
BUN: 11 mg/dL (ref 6–20)
CO2: 28 mmol/L (ref 22–32)
Calcium: 8.5 mg/dL — ABNORMAL LOW (ref 8.9–10.3)
Chloride: 102 mmol/L (ref 101–111)
Creatinine, Ser: 1.34 mg/dL — ABNORMAL HIGH (ref 0.61–1.24)
GFR calc Af Amer: >60 mL/min (ref >60)
GFR calc non Af Amer: >60 mL/min (ref >60)
Glucose, Bld: 129 mg/dL — ABNORMAL HIGH (ref 65–99)
Potassium: 3.5 mmol/L (ref 3.5–5.1)
Sodium: 135 mmol/L (ref 135–145)

## 2016-08-16 LAB — GLUCOSE, CAPILLARY
Glucose-Capillary: 129 mg/dL — ABNORMAL HIGH (ref 65–99)
Glucose-Capillary: 124 mg/dL — ABNORMAL HIGH (ref 65–99)
Glucose-Capillary: 121 mg/dL — ABNORMAL HIGH (ref 65–99)
Glucose-Capillary: 106 mg/dL — ABNORMAL HIGH (ref 65–99)
Glucose-Capillary: 117 mg/dL — ABNORMAL HIGH (ref 65–99)

## 2016-08-16 LAB — CBC
HCT: 35.3 % — ABNORMAL LOW (ref 39.0–52.0)
Hemoglobin: 11 g/dL — ABNORMAL LOW (ref 13.0–17.0)
MCH: 28.2 pg (ref 26.0–34.0)
MCHC: 31.2 g/dL (ref 30.0–36.0)
MCV: 90.5 fL (ref 78.0–100.0)
Platelets: 146 10*3/uL — ABNORMAL LOW (ref 150–400)
RBC: 3.9 MIL/uL — ABNORMAL LOW (ref 4.22–5.81)
RDW: 14.5 % (ref 11.5–15.5)
WBC: 12.9 10*3/uL — ABNORMAL HIGH (ref 4.0–10.5)

## 2016-08-16 LAB — PROTIME-INR
INR: 1.31
Prothrombin Time: 16.4 seconds — ABNORMAL HIGH (ref 11.4–15.2)

## 2016-08-16 MED ORDER — FUROSEMIDE 40 MG PO TABS
40.0000 mg | ORAL_TABLET | Freq: Every day | ORAL | Status: DC
  Administered 2016-08-17 – 2016-08-19 (×3): 40 mg via ORAL
  Filled 2016-08-16 (×3): qty 1

## 2016-08-16 MED ORDER — POTASSIUM CHLORIDE 10 MEQ/50ML IV SOLN
10.0000 meq | INTRAVENOUS | Status: AC | PRN
  Administered 2016-08-16 (×3): 10 meq via INTRAVENOUS

## 2016-08-16 MED ORDER — INSULIN ASPART 100 UNIT/ML ~~LOC~~ SOLN
0.0000 [IU] | Freq: Three times a day (TID) | SUBCUTANEOUS | Status: DC
  Administered 2016-08-16 – 2016-08-17 (×2): 2 [IU] via SUBCUTANEOUS

## 2016-08-16 MED ORDER — SODIUM CHLORIDE 0.9 % IV SOLN: 250.0000 mL | INTRAVENOUS | Status: DC | PRN

## 2016-08-16 MED ORDER — SODIUM CHLORIDE 0.9% FLUSH
3.0000 mL | Freq: Two times a day (BID) | INTRAVENOUS | Status: DC
  Administered 2016-08-17 – 2016-08-18 (×2): 3 mL via INTRAVENOUS

## 2016-08-16 MED ORDER — MAGNESIUM HYDROXIDE 400 MG/5ML PO SUSP: 30.0000 mL | Freq: Every day | ORAL | Status: DC | PRN

## 2016-08-16 MED ORDER — POTASSIUM CHLORIDE 10 MEQ/50ML IV SOLN
INTRAVENOUS | Status: AC
  Administered 2016-08-16: 10 meq via INTRAVENOUS
  Filled 2016-08-16: qty 150

## 2016-08-16 MED ORDER — MOVING RIGHT ALONG BOOK
Freq: Once | Status: AC
  Administered 2016-08-16: 12:00:00
  Filled 2016-08-16: qty 1

## 2016-08-16 MED ORDER — SODIUM CHLORIDE 0.9% FLUSH
3.0000 mL | INTRAVENOUS | Status: DC | PRN
  Administered 2016-08-17: 3 mL via INTRAVENOUS
  Filled 2016-08-16: qty 3

## 2016-08-16 MED ORDER — WARFARIN SODIUM 5 MG PO TABS
5.0000 mg | ORAL_TABLET | Freq: Every day | ORAL | Status: AC
  Administered 2016-08-16: 5 mg via ORAL
  Filled 2016-08-16: qty 1

## 2016-08-16 MED ORDER — POTASSIUM CHLORIDE CRYS ER 20 MEQ PO TBCR
20.0000 meq | EXTENDED_RELEASE_TABLET | Freq: Two times a day (BID) | ORAL | Status: DC
  Administered 2016-08-16 – 2016-08-17 (×4): 20 meq via ORAL
  Filled 2016-08-16 (×6): qty 1

## 2016-08-16 NOTE — Progress Notes (Signed)
CARDIAC REHAB PHASE I   PRE:  Rate/Rhythm: 77 SR  BP:  Sitting: 113/72        SaO2: 96 RA  MODE:  Ambulation: 550 ft   POST:  Rate/Rhythm: 82 SR  BP:  Sitting: 110/77         SaO2: 94 RA  Pt doing very well, very independent, good use of sternal precautions. Pt ambulated 550 ft on RA, rolling walker (only for first half of walk, assist x1, steady gait, tolerated well with no complaints. Encouraged IS, additional ambulation x1 today. Pt to edge of bed per pt request after walk, call bell within reach. Will follow up tomorrow.   [phone] Joylene GrapesEmily C Shonte Beutler, RN, BSN 08/16/2016 3:06 PM

## 2016-08-16 NOTE — Op Note (Signed)
NAMEROGERS, DITTER NO.:  [id number]  MEDICAL RECORD NO.:  [id number]  LOCATION:  2S05C                        FACILITY:  [facility]  PHYSICIAN:  Kerin Perna, M.D.  DATE OF BIRTH:  04-27-1971  DATE OF PROCEDURE:  08/14/2016 DATE OF DISCHARGE:                              OPERATIVE REPORT   OPERATION:  Aortic valve replacement with a 25 mm mechanical Carbomedics supra-annular valve (serial #[id number]).  SURGEON:  Kerin Perna, M.D.  ASSISTANT:  Rowe Clack, P.A.-C.  ANESTHESIA:  General by Dr. Sharee Holster.  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  CLINICAL NOTE:  The patient is a 45 year old, Caucasian male with obesity and history of cardiac murmur.  He recently developed exertional symptoms of shortness of breath and decreasing exercise tolerance.  His cardiologist documented severe aortic stenosis and moderate aortic insufficiency of a bicuspid aortic valve by transthoracic echocardiogram.  The patient underwent left heart catheterization which showed normal coronaries.  His transvalvular gradient was over 50 mmHg and aortic valve replacement was recommended.  I examined the patient in the office and reviewed the results of his studies and discussed the role of aortic valve replacement for treatment of his severe aortic valve stenosis-insufficiency.  I discussed the major aspects of the surgery including the use of general anesthesia and cardiopulmonary bypass, the location of the surgical incisions, and the expected postoperative hospital recovery.  I reviewed with the patient the risks to him of aortic valve replacement including the risk of stroke, bleeding, blood transfusion requirement, infection, MI, postoperative pulmonary problems  including pleural effusion, postoperative cardiac arrhythmia including the need for pacemaker, and death.  After reviewing these issues, he demonstrated his understanding and agreed to proceed with surgery under what I felt was an informed consent.  FINDINGS: 1. Severely calcified bicuspid aortic valve.  Successfully replaced     with a 25 mm mechanical Carbomedics valve. 2. Mild dilatation of the ascending aorta. 3. 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 4. Normal functioning of the aortic prosthetic valve after separation     from cardiopulmonary bypass by TEE.  OPERATIVE PROCEDURE:  The patient was brought to the operating room, placed supine on the operating table.  General anesthesia was induced under invasive hemodynamic monitoring.  A transesophageal echo probe was placed by the anesthesiologist.  The patient was prepped and draped as a sterile field.  A proper time-out was performed.  A sternal incision was made.  The deep blades of the sternal retractors were needed because of the patient's short obese body habitus.  The pericardium was opened and suspended.  Heparin was administered.  Pursestrings were placed in the ascending aorta and right atrium.  When the ACT was documented as being therapeutic, the patient was cannulated and placed on cardiopulmonary bypass.  It should be noted that the patient's ascending aorta was foreshortened because of his large heart and a short obese body habitus.  A left ventricular vent was placed via the right superior pulmonary vein.  Cardioplegia cannulas were placed  both antegrade and retrograde cold blood cardioplegia.  The aortic crossclamp was applied and a liter of cold blood cardioplegia was delivered in split doses between the antegrade aortic and retrograde coronary sinus catheters.  There was good cardioplegic arrest, and septal temperature dropped less than 14 degrees.  Cardioplegia was delivered every 20  minutes while the crossclamp was in place.  A transverse aortotomy was performed.  The aortic valve was inspected. It was heavily calcified bicuspid and insufficient.  It was excised and the annulus was debrided of calcium.  The outflow tract was irrigated with copious amounts of cold saline.  The annulus was sized to a 25 mm Carbomedics-Sorin valve.  Pledgeted 2-0 Ethibond mattress sutures were then placed subannular, positioned around the annulus, numbering 14 total.  The valve was prepared and the sutures were placed through the sewing ring.  The valve was seated and the sutures were tied.  The valve conformed to the annulus wall.  The tilting disc opened and closed without impingement.  The coronary ostium were unobstructed and widely patent, and there were no spaces in between the sutures for perivalvular leak.  The valve was then again irrigated with cold sterile saline.  The aortotomy was closed in layers using running 4-0 Prolene.  Air was vented from the left side of the heart with a dose of retrograde warm blood cardioplegia in the usual de-airing maneuvers.  The crossclamp was removed.  The heart resumed a spontaneous rhythm.  The patient was rewarmed and reperfused.  The aortotomy was inspected and found to be hemostatic. The LV vent was removed.  Temporary pacing wires were applied.  The lungs were expanded, ventilator was resumed.  The patient was then weaned from cardiopulmonary bypass after being adequately reperfused. Inotropes were not needed.  Echo showed preserved LV function and the aortic valve was functioning normally.  Protamine was administered without adverse reaction.  The cannulas were removed.  The LV vent was removed.  The aortic vent was removed.  The patient remained hemodynamically stable.  After the heparin was reversed, the mediastinum was irrigated.  Superior pericardial fat was closed over the aorta.  Anterior mediastinal and posterior mediastinal  tubes were placed and brought out through separate incisions.  The sternum was closed with interrupted steel wire.  The pectoralis fascia was closed with a running #1 Vicryl.  The subcutaneous and skin layers were closed in running Vicryl and sterile dressings were applied.  Total cardiopulmonary bypass time was 110 minutes.     Kerin PernaPeter Van Trigt, M.D.     PV/MEDQ  D:  08/15/2016  T:  08/16/2016  Job:  [id number]  cc:   Daryel NovemberGary Miller, MD

## 2016-08-16 NOTE — Progress Notes (Signed)
2 Days Post-Op Procedure(s) (LRB): AORTIC VALVE REPLACEMENT (AVR) USING 25MM CARBOMEDIC MECHANICAL VALVE (N/A) TRANSESOPHAGEAL ECHOCARDIOGRAM (TEE) (N/A) Subjective: HR off pacer 72/min CXR clear Ready to transfer to floor  Objective: Vital signs in last 24 hours: Temp:  [98.6 F (37 C)-99.4 F (37.4 C)] 99.2 F (37.3 C) (08/31 0741) Pulse Rate:  [71-89] 71 (08/31 0900) Resp:  [10-20] 11 (08/31 0900) BP: (83-104)/(58-81) 92/71 (08/31 0900) SpO2:  [92 %-97 %] 96 % (08/31 0900) Weight:  [249 lb 5.4 oz (113.1 kg)] 249 lb 5.4 oz (113.1 kg) (08/31 0628)  Hemodynamic parameters for last 24 hours:   stable Intake/Output from previous day: 08/30 0701 - 08/31 0700 In: 2325 [P.O.:1200; I.V.:525; IV Piggyback:600] Out: 2005 [Urine:1805; Chest Tube:200] Intake/Output this shift: Total I/O In: -  Out: 80 [Urine:40; Chest Tube:40]       Exam    General- alert and comfortable   Lungs- clear without rales, wheezes   Cor- regular rate and rhythm, no murmur , gallop   Abdomen- soft, non-tender   Extremities - warm, non-tender, minimal edema   Neuro- oriented, appropriate, no focal weakness   Lab Results:  Recent Labs  08/15/16 1545 08/16/16 0400  WBC 13.4* 12.9*  HGB 11.6* 11.0*  HCT 36.5* 35.3*  PLT 151 146*   BMET:  Recent Labs  08/15/16 0404 08/15/16 1544 08/15/16 1545 08/16/16 0400  NA 138 139  --  135  K 4.5 3.9  --  3.5  CL 107 98*  --  102  CO2 23  --   --  28  GLUCOSE 119* 129*  --  129*  BUN 12 13  --  11  CREATININE 1.24 1.30* 1.39* 1.34*  CALCIUM 8.4*  --   --  8.5*    PT/INR:  Recent Labs  08/16/16 0400  LABPROT 16.4*  INR 1.31   ABG    Component Value Date/Time   PHART 7.274 (L) 08/14/2016 1806   HCO3 22.5 08/14/2016 1806   TCO2 25 08/15/2016 1544   ACIDBASEDEF 4.0 (H) 08/14/2016 1806   O2SAT 93.0 08/14/2016 1806   CBG (last 3)   Recent Labs  08/15/16 2340 08/16/16 0344 08/16/16 0736  GLUCAP 103* 129* 124*     Assessment/Plan: S/P Procedure(s) (LRB): AORTIC VALVE REPLACEMENT (AVR) USING 25MM CARBOMEDIC MECHANICAL VALVE (N/A) TRANSESOPHAGEAL ECHOCARDIOGRAM (TEE) (N/A) Mobilize Diuresis Diabetes control Plan for transfer to step-down: see transfer orders cont coumadin   LOS: 2 days    [name] [name] 08/16/2016

## 2016-08-17 ENCOUNTER — Inpatient Hospital Stay (HOSPITAL_COMMUNITY): Payer: Managed Care, Other (non HMO)

## 2016-08-17 LAB — BASIC METABOLIC PANEL
Anion gap: 7 (ref 5–15)
BUN: 14 mg/dL (ref 6–20)
CO2: 27 mmol/L (ref 22–32)
Calcium: 9 mg/dL (ref 8.9–10.3)
Chloride: 105 mmol/L (ref 101–111)
Creatinine, Ser: 1.32 mg/dL — ABNORMAL HIGH (ref 0.61–1.24)
GFR calc Af Amer: >60 mL/min (ref >60)
GFR calc non Af Amer: >60 mL/min (ref >60)
Glucose, Bld: 111 mg/dL — ABNORMAL HIGH (ref 65–99)
Potassium: 4.7 mmol/L (ref 3.5–5.1)
Sodium: 139 mmol/L (ref 135–145)

## 2016-08-17 LAB — GLUCOSE, CAPILLARY
Glucose-Capillary: 120 mg/dL — ABNORMAL HIGH (ref 65–99)
Glucose-Capillary: 164 mg/dL — ABNORMAL HIGH (ref 65–99)
Glucose-Capillary: 115 mg/dL — ABNORMAL HIGH (ref 65–99)
Glucose-Capillary: 124 mg/dL — ABNORMAL HIGH (ref 65–99)

## 2016-08-17 LAB — CBC
HCT: 35.6 % — ABNORMAL LOW (ref 39.0–52.0)
Hemoglobin: 11.3 g/dL — ABNORMAL LOW (ref 13.0–17.0)
MCH: 28.5 pg (ref 26.0–34.0)
MCHC: 31.7 g/dL (ref 30.0–36.0)
MCV: 89.7 fL (ref 78.0–100.0)
Platelets: 168 10*3/uL (ref 150–400)
RBC: 3.97 MIL/uL — ABNORMAL LOW (ref 4.22–5.81)
RDW: 14.5 % (ref 11.5–15.5)
WBC: 13.8 10*3/uL — ABNORMAL HIGH (ref 4.0–10.5)

## 2016-08-17 LAB — PROTIME-INR
INR: 1.22
Prothrombin Time: 15.5 seconds — ABNORMAL HIGH (ref 11.4–15.2)

## 2016-08-17 MED ORDER — WARFARIN SODIUM 7.5 MG PO TABS
7.5000 mg | ORAL_TABLET | Freq: Every day | ORAL | Status: AC
  Administered 2016-08-17: 7.5 mg via ORAL
  Filled 2016-08-17: qty 1

## 2016-08-17 MED ORDER — SORBITOL 70 % PO SOLN
60.0000 mL | Freq: Once | ORAL | Status: DC
  Filled 2016-08-17: qty 60

## 2016-08-17 NOTE — Discharge Summary (Signed)
Physician Discharge Summary  Patient ID: [name] [name] MRN: [id number] DOB/AGE: [birth year] 45 y.o.  Admit date: 08/14/2016 Discharge date: 08/19/2016  Admission Diagnoses: Patient Active Problem List   Diagnosis Date Noted  . S/P AVR 08/14/2016  . Aortic stenosis 07/09/2016   Discharge Diagnoses:  Active Problems: Aortic stenosis S/P mechanical AVR   Discharged Condition: good  HPI:  This is a 45 year old obese nondiabetic reformed smoker with a diagnosis of severe aortic stenosis and moderate aortic insufficiency with exertional symptoms of shortness of breath and dizziness-presyncope. The patient was found have a cardiac murmur and 2010. At that time echocardiogram demonstrated no significant valvular abnormalities according to the patient. At that time he was asymptomatic. No further echoes were performed until recent onset of symptoms-dyspnea on exertion and 2 or 3 episodes of exertional presyncope or dizziness. The patient denies any resting symptoms of chest pain, orthopnea, PND, or ankle edema. There is no family history of aortic valve disease. The patientscardiologist, Dr. Daryel [name], feels the patient has a bicuspid aortic valve. The echo disc the patient brought today does not provide images of the valve to interpret at this time. Left and right heart cath data shows a mildly dilated ascending aorta with well-preserved LV systolic function. He would aortic valve area was 0.8 with a heavily calcified aortic valve. LVEDP is measured at 20 mmHg. Cardiac output was 5.5 L/m. Were no significant coronary artery lesions. Since initial consultation the patient has had some episodes of transient dizziness. His exertional shortness of breath has not worsened. He denies any symptoms of upper respiratory infection. Prior to today's visit the patient underwent a CT scan of the chest which demonstrated the ascending aorta diameter at 4.2 cm which would not require replacement in this patient  with a large BSA. He was examined by his dentist and has documentation that his oral hygiene is adequate for valve replacement surgery. The patient and his family have discussed the options of tissue valve versus mechanical valve. They understand that I would recommend mechanical valve due to the patient's young age and the risk of premature calcification. There is still concern over a lifelong commitment to Coumadin therapy and we'll decide on the valve category prior to surgery.   Hospital Course:  On 08/14/2016 at [name] [name] underwent an aortic valve replacement with a mechanical valve. He tolerated the procedure well, and was transferred to ICU. He remained Hemodynamically stable, however required pressor support at this time. We began Coumadin for anticoagulation for his mechanical valve. We continued a diuretic regimen due to fluid overload. He started on a regimen for his blood glucose level. He progressed appropriately and his chest tubes were removed. We continue to titrate his oxygen supplementation. On postoperative day 3 he was transferred to the telemetry unit. We continue to titrate his beta blocker and held his ACE inhibitor at this time due to borderline blood pressure. We continued his wires at this time due to borderline bradycardia. His INR remains subtherapeutic, therefore we increased his Coumadin dosage. On chest x-ray it showed small bilateral pleural effusions, therefore we continued his diuretic regimen. His epicardial pacing wires were removed on 09/02. On 08/19/2016, his INR is up to 1.47 and he is on Coumadin 7.5 mg daily. I will give him this dose today before he goes home. He will then take Coumadin 5 mg daily or as directed starting 08/20/2016. He was intructed to have his PT and INR drawn on Tuesday 08/21/2016 at Dr. Rondel Baton office. His  ecasa was decreased to 81 mg daily. He is ambulating on room air. He is tolerating a diet and has had a bowel movement. He is felt surgically  stable for discharge today.  Consults: Cardiac rehab  Significant Diagnostic Studies: CLINICAL DATA:  Status post aortic valve replacement  EXAM: CHEST  2 VIEW  COMPARISON:  Portable chest x-ray of August 16, 2016  FINDINGS: The lungs are borderline hypoinflated. Small pleural effusions blunt the posterior costophrenic angles bilaterally. The cardiac silhouette remains enlarged. The pulmonary vascularity is not engorged. The sternal wires are intact. The prosthetic aortic valve ring is visible. The right internal jugular Cordis sheath has been removed. The bony structures exhibit no acute abnormalities.  IMPRESSION: Small bilateral posterior layering pleural effusions. No pulmonary edema. Stable mild cardiomegaly.   Electronically Signed   By: [name]  [name] M.D.   On: 08/17/2016 07:08   Treatments:  PHYSICIAN:  [name] [name], M.D.  DATE OF BIRTH:  10-Feb-1971  DATE OF PROCEDURE:  08/14/2016 DATE OF DISCHARGE:                              OPERATIVE REPORT   OPERATION:  Aortic valve replacement with a 25 mm mechanical Carbomedics supra-annular valve (serial #[id number]).  SURGEON:  [name] [name], M.D.  ASSISTANT:  [name] [name], P.A.-C.  Discharge Exam: Blood pressure 111/68, pulse 70, temperature 98.3 F (36.8 C), temperature source Oral, resp. rate 17, height 5\' 6"  (1.676 m), weight 236 lb 12.8 oz (107.4 kg), SpO2 98 %.   Cardiovascular: RRR, no murmur Pulmonary: Clear to auscultation bilaterally Abdomen: Soft, non tender, bowel sounds present. Extremities: Trace bilateral lower extremity edema. Wounds: Clean and dry.  No erythema or signs of infection.  Disposition: Final discharge disposition not confirmed     Medication List    STOP taking these medications   carvedilol 6.25 MG tablet Commonly known as:  COREG   lisinopril 20 MG tablet Commonly known as:  PRINIVIL,ZESTRIL     TAKE these medications   allopurinol 300 MG  tablet Commonly known as:  ZYLOPRIM Take 300 mg by mouth daily.   aspirin 81 MG tablet Take 81 mg by mouth daily.   atorvastatin 40 MG tablet Commonly known as:  LIPITOR Take 40 mg by mouth daily at 6 PM.   coumadin book Misc 1 each by Does not apply route once.   indomethacin 50 MG capsule Commonly known as:  INDOCIN Take 50 mg by mouth daily as needed.   metoprolol tartrate 25 MG tablet Commonly known as:  LOPRESSOR Take 0.5 tablets (12.5 mg total) by mouth 2 (two) times daily.   oxyCODONE 5 MG immediate release tablet Commonly known as:  Oxy IR/ROXICODONE Take 1-2 tablets (5-10 mg total) by mouth every 4 (four) hours as needed for severe pain.   warfarin 5 MG tablet Commonly known as:  COUMADIN Take 1 tablet (5 mg total) by mouth once. Or as directed.      The patient has been discharged on:   1.Beta Blocker:  Yes [x   ]                              No   [   ]  If No, reason:  2.Ace Inhibitor/ARB: Yes [   ]                                     No  [ x   ]                                     If No, reason: Titration BB, borderline hypotensive  3.Statin:   Yes [x   ]                  No  [   ]                  If No, reason:  4.Ecasa:  Yes  [ x ]                  No   [   ]                  If No, reason:   Follow-up Information    Mikey Bussing, MD .   Specialty:  Cardiothoracic Surgery Why:  Appointment is on 09/19/2016 at 12:30. Please report to [facility] imaging at 12:00 for a chest xray. They are located on the first floor of our building.  Contact information: [address] [employer] [address] [place] [address] [phone]        Theodore Demark, PA-C Follow up on 08/27/2016.   Specialties:  Cardiology, Radiology Why:  Cardiology Follow-Up on 08/27/2016 at 11:00 for Aortic Valve Replacement.  Contact information: [address] [place] [address] [phone]        [facility], MD .    Specialty:  Internal Medicine Why:  Call to have PT and INR (on Coumadin, mechanical valve) drawn on Tuesday 08/21/2016 Contact information: [address], [address] [place] [address] [phone]           Signed: Ardelle Balls PA-C 08/19/2016, 9:49 AM

## 2016-08-17 NOTE — Care Management Note (Signed)
Case Management Note  Patient Details  Name: [name] [name] MRN: [id number] Date of Birth: August 16, 1971  Subjective/Objective:   Pt lives with wife who states she is a Designer, jewelleryregistered nurse and will be available to assist pt as needed when discharged.                               Expected Discharge Plan:  Home/Self Care  Discharge planning Services  CM Consult  Status of Service:  Completed, signed off  Maquita Sandoval, Chapman FitchHenrietta T, CaliforniaRN 08/17/2016, 8:21 AM

## 2016-08-17 NOTE — Progress Notes (Signed)
CARDIAC REHAB PHASE I   Pt ambulating independently, no complaints. Cardiac surgery discharge education completed with pt and wife at bedside. Reviewed IS, sternal precautions, activity progression, exercise,  heart healthy diet,sodium restrictions, daily weights and phase 2 cardiac rehab. Pt and wife verbalized understanding, receptive to education. Pt declines  phase 2 cardiac rehab referral at this time. Pt in recliner, call bell within reach, will sign off.   [id number] Joylene GrapesEmily C Morgen Ritacco, RN, BSN 08/17/2016 12:44 PM

## 2016-08-17 NOTE — Progress Notes (Addendum)
      [address]       [place] [id number]             [phone]      3 Days Post-Op Procedure(s) (LRB): AORTIC VALVE REPLACEMENT (AVR) USING 25MM CARBOMEDIC MECHANICAL VALVE (N/A) TRANSESOPHAGEAL ECHOCARDIOGRAM (TEE) (N/A) Subjective: Doing well. No issues overnight. No chest pain or SOB.   Objective: Vital signs in last 24 hours: Temp:  [98.9 F (37.2 C)-99.3 F (37.4 C)] 99.1 F (37.3 C) (09/01 0546) Pulse Rate:  [65-74] 65 (09/01 0546) Resp:  [11-18] 16 (09/01 0546) BP: (92-106)/(54-71) 106/66 (09/01 0546) SpO2:  [93 %-99 %] 93 % (09/01 0546) Weight:  [247 lb 6.4 oz (112.2 kg)] 247 lb 6.4 oz (112.2 kg) (09/01 0546)     Intake/Output from previous day: 08/31 0701 - 09/01 0700 In: 360 [P.O.:360] Out: 555 [Urine:190; Chest Tube:365] Intake/Output this shift: No intake/output data recorded.  General appearance: alert and cooperative Heart: regular rate and rhythm, S1, S2 normal, no murmur, click, rub or gallop Lungs: clear to auscultation bilaterally Abdomen: soft, non-tender; bowel sounds normal; no masses,  no organomegaly Extremities: extremities normal, atraumatic, no cyanosis or edema Wound: C/D/I, no drainage  Lab Results:  Recent Labs  08/16/16 0400 08/17/16 0502  WBC 12.9* 13.8*  HGB 11.0* 11.3*  HCT 35.3* 35.6*  PLT 146* 168   BMET:  Recent Labs  08/16/16 0400 08/17/16 0502  NA 135 139  K 3.5 4.7  CL 102 105  CO2 28 27  GLUCOSE 129* 111*  BUN 11 14  CREATININE 1.34* 1.32*  CALCIUM 8.5* 9.0    PT/INR:  Recent Labs  08/17/16 0502  LABPROT 15.5*  INR 1.22   ABG    Component Value Date/Time   PHART 7.274 (L) 08/14/2016 1806   HCO3 22.5 08/14/2016 1806   TCO2 25 08/15/2016 1544   ACIDBASEDEF 4.0 (H) 08/14/2016 1806   O2SAT 93.0 08/14/2016 1806   CBG (last 3)   Recent Labs  08/16/16 1619 08/16/16 2047 08/17/16 0632  GLUCAP 106* 117* 120*    Assessment/Plan: S/P Procedure(s) (LRB): AORTIC VALVE  REPLACEMENT (AVR) USING 25MM CARBOMEDIC MECHANICAL VALVE (N/A) TRANSESOPHAGEAL ECHOCARDIOGRAM (TEE) (N/A)  1. CV- NSR rate in the 60s. Good BP control. Continue Lopressor. Continue statin  2. Pulm- CXR today showing small bilateral pleural effusions. Tolerating RA with good oxygen saturation.  3. Renal- Stable creatinine. Tolerated Lasix 40mg  daily. Continues to be fluid overloaded and over baseline weight. Continue diuresis.   4. Endocrine-well controlled on Levemir and SSI  5. Mechanical Valve- continue Coumadin. INR 1.22. 7.5mg  Coumadin today.   Plan: Continue anticoagulation with Coumadin. Continue wires, can likely pull tomorrow. Holding ACEI due to borderline BP.  Ambulate TID. Likely home when INR therapeutic.    LOS: 3 days    Sharlene Doryessa N Conte 08/17/2016  Progressing well, agree with above assessment and plan Cont low dose lopressor with HR , 70 Coumadin dose increased to 7.5 Plan DC wires sat and home sun/mon when INR over 1.5 He will get INR draws and coumadin regulation [facility]  No insulin at home or po hypoglycemics No lasix at home  P [name] [name] NieceVan trigt

## 2016-08-18 LAB — GLUCOSE, CAPILLARY
Glucose-Capillary: 113 mg/dL — ABNORMAL HIGH (ref 65–99)
Glucose-Capillary: 123 mg/dL — ABNORMAL HIGH (ref 65–99)

## 2016-08-18 LAB — PROTIME-INR
INR: 1.26
Prothrombin Time: 15.9 seconds — ABNORMAL HIGH (ref 11.4–15.2)

## 2016-08-18 MED ORDER — WARFARIN SODIUM 7.5 MG PO TABS
7.5000 mg | ORAL_TABLET | Freq: Once | ORAL | Status: AC
  Administered 2016-08-18: 7.5 mg via ORAL
  Filled 2016-08-18: qty 1

## 2016-08-18 MED ORDER — WARFARIN VIDEO
Freq: Once | Status: AC
  Administered 2016-08-18: 18:00:00

## 2016-08-18 MED ORDER — PATIENT'S GUIDE TO USING COUMADIN BOOK
Freq: Once | Status: AC
  Administered 2016-08-18: 18:00:00
  Filled 2016-08-18: qty 1

## 2016-08-18 NOTE — Progress Notes (Signed)
Patient experience some junctional beats at approximately 22:00 and now has experienced 2.44 pause and brady down to upper 30s. The patient was checked and found resting with no complaints and asymptomatic. Will continue to monitor.

## 2016-08-18 NOTE — Discharge Instructions (Addendum)
Aortic Valve Replacement  Aortic valve replacement is a procedure to replace an aortic valve that cannot be repaired. An artificial (prosthetic) valve is used to do this. Three types of prosthetic valves are available:   Mechanical valves made entirely from man-made materials.   Donor valves made from human donors. These are only used in special situations.   Biological valves made from animal tissues.  The type of prosthetic valve used will be determined based on various factors, including your age, your lifestyle, and other medical conditions you have.  LET [facility] PROVIDER KNOW ABOUT:  Any allergies you have.  All medicines you are taking, including vitamins, herbs, eye drops, creams, and over-the-counter medicines.  Previous problems you or members of your family have had with the use of anesthetics.  Any blood disorders you have.  Previous surgeries you have had.  Medical conditions you have. RISKS AND COMPLICATIONS Generally, this is a safe procedure. However, as with any procedure, problems can occur. Possible problems include:   Blood clotting caused by the new valve. Replacement with a mechanical valve requires lifelong treatment with medicine to prevent blood clots.   Infection in the new valve.   Valve failure.   Bleeding.   Reaction to anesthetics.  BEFORE THE PROCEDURE  Ask your health care provider about:  Changing or stopping your regular medicines. This is especially important if you are taking diabetes medicines or blood thinners.  Taking medicines such as aspirin and ibuprofen. These medicines can thin your blood. Do not take these medicines before your procedure if your health care provider asks you not to.  Do not eat or drink anything after midnight on the night before the procedure or as directed by your health care provider. PROCEDURE  The surgeon may use either an open technique or a minimally invasive technique for this surgery:    Traditional open surgery  You will be given a medicine that makes you fall asleep (general anesthetic).  You will then be placed on a heart-lung bypass machine. This machine provides oxygen to your blood while the heart is undergoing surgery.  During surgery, the surgeon will make a large cut (incision) in the chest.  The heart will be cooled to slow or stop the heartbeat.  The damaged aortic valve will be removed and replaced with a prosthetic heart valve.  The incision will then be closed with staples or stitches. Minimally invasive surgery This is done through a smaller incision. This still requires general anesthetic and the heart-lung bypass machine. Your heart will be cooled to slow or stop the heartbeat, allowing the damaged valve to be removed and replaced with the new valve. The smaller incision will then be closed. If your condition allows for this procedure, there is often less blood loss, less pain, and faster recovery compared to traditional open surgery.  AFTER THE PROCEDURE You will be monitored closely in a recovery area. From there, you will likely go to an intensive care unit.    This information is not intended to replace advice given to you by your health care provider. Make sure you discuss any questions you have with your health care provider.   Document Released: 04/24/2005 Document Revised: 12/24/2014 Document Reviewed: 09/16/2012 Elsevier Interactive Patient Education 2016 [employer]     Information on my medicine - Coumadin   (Warfarin)  This medication education was reviewed with me or my healthcare representative as part of my discharge preparation.  The pharmacist that spoke with me  during my hospital stay was:  Severiano Gilbert, [facility]  Why was Coumadin prescribed for you? Coumadin was prescribed for you because you have a blood clot or a medical condition that can cause an increased risk of forming blood clots. Blood clots can cause serious health  problems by blocking the flow of blood to the heart, lung, or brain. Coumadin can prevent harmful blood clots from forming. As a reminder your indication for Coumadin is:   Blood Clot Prevention After Heart Valve Surgery  What test will check on my response to Coumadin? While on Coumadin (warfarin) you will need to have an INR test regularly to ensure that your dose is keeping you in the desired range. The INR (international normalized ratio) number is calculated from the result of the laboratory test called prothrombin time (PT).  If an INR APPOINTMENT HAS NOT ALREADY BEEN MADE FOR YOU please schedule an appointment to have this lab work done by your health care provider within 7 days. Your INR goal is usually a number between:  2 to 3 or your provider may give you a more narrow range like 2-2.5.  Ask your health care provider during an office visit what your goal INR is.  What  do you need to  know  About  COUMADIN? Take Coumadin (warfarin) exactly as prescribed by your healthcare provider about the same time each day.  DO NOT stop taking without talking to the doctor who prescribed the medication.  Stopping without other blood clot prevention medication to take the place of Coumadin may increase your risk of developing a new clot or stroke.  Get refills before you run out.  What do you do if you miss a dose? If you miss a dose, take it as soon as you remember on the same day then continue your regularly scheduled regimen the next day.  Do not take two doses of Coumadin at the same time.  Important Safety Information A possible side effect of Coumadin (Warfarin) is an increased risk of bleeding. You should call your healthcare provider right away if you experience any of the following: ? Bleeding from an injury or your nose that does not stop. ? Unusual colored urine (red or dark brown) or unusual colored stools (red or black). ? Unusual bruising for unknown reasons. ? A serious fall or if you  hit your head (even if there is no bleeding).  Some foods or medicines interact with Coumadin (warfarin) and might alter your response to warfarin. To help avoid this: ? Eat a balanced diet, maintaining a consistent amount of Vitamin K. ? Notify your provider about major diet changes you plan to make. ? Avoid alcohol or limit your intake to 1 drink for women and 2 drinks for men per day. (1 drink is 5 oz. wine, 12 oz. beer, or 1.5 oz. liquor.)  Make sure that ANY health care provider who prescribes medication for you knows that you are taking Coumadin (warfarin).  Also make sure the healthcare provider who is monitoring your Coumadin knows when you have started a new medication including herbals and non-prescription products.  Coumadin (Warfarin)  Major Drug Interactions  Increased Warfarin Effect Decreased Warfarin Effect  Alcohol (large quantities) Antibiotics (esp. Septra/Bactrim, Flagyl, Cipro) Amiodarone (Cordarone) Aspirin (ASA) Cimetidine (Tagamet) Megestrol (Megace) NSAIDs (ibuprofen, naproxen, etc.) Piroxicam (Feldene) Propafenone (Rythmol SR) Propranolol (Inderal) Isoniazid (INH) Posaconazole (Noxafil) Barbiturates (Phenobarbital) Carbamazepine (Tegretol) Chlordiazepoxide (Librium) Cholestyramine (Questran) Griseofulvin Oral Contraceptives Rifampin Sucralfate (Carafate) Vitamin K   Coumadin (  Warfarin) Major Herbal Interactions  Increased Warfarin Effect Decreased Warfarin Effect  Garlic Ginseng Ginkgo biloba Coenzyme Q10 Green tea St. Johns wort    Coumadin (Warfarin) FOOD Interactions  Eat a consistent number of servings per week of foods HIGH in Vitamin K (1 serving =  cup)  Collards (cooked, or boiled & drained) Kale (cooked, or boiled & drained) Mustard greens (cooked, or boiled & drained) Parsley *serving size only =  cup Spinach (cooked, or boiled & drained) Swiss chard (cooked, or boiled & drained) Turnip greens (cooked, or boiled & drained)    Eat a consistent number of servings per week of foods MEDIUM-HIGH in Vitamin K (1 serving = 1 cup)  Asparagus (cooked, or boiled & drained) Broccoli (cooked, boiled & drained, or raw & chopped) Brussel sprouts (cooked, or boiled & drained) *serving size only =  cup Lettuce, raw (green leaf, endive, romaine) Spinach, raw Turnip greens, raw & chopped   These websites have more information on Coumadin (warfarin):  http://www.king-russell.com/; https://www.hines.net/;

## 2016-08-18 NOTE — Progress Notes (Addendum)
      [address]       [place] [address]             [phone]        4 Days Post-Op Procedure(s) (LRB): AORTIC VALVE REPLACEMENT (AVR) USING 25MMAnne H304143RegeniaSouth 119LeoGrand Island [phone] BorRegFielDarrick PennMesquite Rehabilitation [phone] KitchenHar0Kara MeadhProgramme researcher, [phone]Logan BorRegRiveDarrick PennBroadwater Health [phone] KitchenHar0Kara MeadhProgramme [phone]gan BorRegSagaDarrick PennC[phone] KitchenHar0Kara MeadhProgramme researcher909Le35RegeniFirstCore1MarisLogan BorRegSt. CleDarrick PennPiedmont [facility]ayette [phone] KitchenHar0Kara MeadhProgramme researcher, [phone]sLogan BorRegJuniper CaDarrick PennCoral Gables [phone] KitchenHar0Kara MeadhProgra807-6Le73RCore1MarisLogan BorRegWhitfDarrick PennEncompass Health [phone] KitchenHar0Kara [phone] BorRegSun City Darrick PennMedical City Of [phone] KitchenHar0Kara MeadhProgramme researcher, [phone] BorRegBloomsDarrick PennDepartment Of State Hospital - [phone] KitchenHar0Kara MeadhProgramme researcher, broadcas45tiLajoyce CorneMRenata Cap61ric78SRichV609-1Le45RegeniFCore1MarisLogan BorRegMcBDarrick PennCarson Tahoe Regional Medical [phone] KitchenHar0Kara [phone] BorRegKoDarrick PennWyoming Behavioral [phone] KitchenHar0Kara MeadhProgramme [phone] BorRegOlDarrick PennSunbury Community [phone] KitchenHar0Kara MeadhProgramme researcher, broadcas40tiL843-5Le57RegeniFirstEnSprinCore1MarisLogan BorRegBDarrick PennMedstar Surgery [facility] [phone] KitchenHar0Kara MeadhProgramme researcher, broadcas39tiLajoyce CorneMRenata Cap57Victorino [phone] BorRegDarrick PennTeton Outpatient Services [phone] KitchenHar0Kara MeadhProgramme researcher, broadcas29614Le66RegeniFirCore1MarisLogan BorRegMaDarrick PennSelect [facility]ty [facility] - [phone] KitchenHar0Kara MeadhProgramme researcher, broadcas32tiLajoyce CorneM562Le73RegeniFirCore1MarisLogan BorRegHaDarrick PennClinical [facility] As3155514Marland KitchenHar0Kara MeadhProgramme [phone] BorRegKaDarrick PennCollege [facility] D/P Ap571 040 1Marland KitchenHar0Kara MeadhProgramme researcher, broadcas32t962Clayto925-5LCore1MarisLogan BorRegRoman FoDarrick PennSage Specialty [phone] KitchenHar0Kara MeadhProg([phone] BorRegBethDarrick PennSt. Elizabeth Medical [phone] KitchenHar0Kara MeadhProgramme researcher, [id number]n BorRegGuernevDarrick PennCataract And Laser [phone] KitchenHar0Kara MeadhProgramme researcher, broadc434-6Le60RegeniCore1MarisLogan BorRegChiDarrick PennMillenium Surgery Center [phone] KitchenHar0Kara MeadhProgramme rese435-6Le20RegeniCore1MarisLogan BorRegOak Darrick PennUt [facility] [phone] KitchenHar0Kara MeadhProgramme research517-3Le55RegeniFiCore1MarisLogan BorRegArbury HDarrick PennGuthrie County [phone] KitchenHar0Kara MeadhProgramme resear682-0Le48RegeCore1MarisLogan BorRegDarrick PennRegional Eye Surgery Center In512 876 0Marland KitchenHar0Kara MeadhProgramme researcher, [phone] BorRegWaltDarrick PennCalvary [phone] KitchenHar0Kara MeadhProgramme researcher, broadcas58tiL([phone]n BorRegGapDarrick PennBleckley Memorial [phone] KitchenHar0Kara MeadhProgramme researcher, [phone] BorRegDarrick PennColumbia Memorial [phone] KitchenHar0Kara MeadhProgramme researcher, [phone] BorRegVDarrick PennSalinas Surgery Cente671 [phone] 2Marland KitchenHar0Kara MeadhProgramme researcher, bro779-0Le34Core1MarisLogan BorRegCorDarrick PennPathway Rehabilitation Hospial Of [phone] KitchenHar0Kara MeadhProgramme rese([phone]sLogan BorRegRobDarrick PennSmoke Ranc510-4Le1RegenCore1MarisLogan BorRegSouth HeiDarrick PennFairfield Memorial [phone] KitchenHar0Kara MeadhProgramme researcher, broadcas40tiLajoyce CorneMRenata Cap49ric52Mon912 6Le1RegCore1MarisLogan BorRegCountry Darrick PennKaiser Fnd Hosp - [phone] KitchenHar0Kara MeadhProgramme researcher, [id number]an BorRegHillsDarrick PennSierra Vista Regional Health Cente726 204 1Marland KitchenHar0Kara MeadhProgramme [phone] BorRegVDarrick PennMusc Medical Cente8387276Marland KitchenHar0Kara MeadhProgramme researcher, [id number]ogan BorRegPollock PDarrick PennCohen Children’S Medical [phone] KitchenHar0Kara MeadhProgramme researcher, br469Le75RegCore1MarisLogan BorRegSan AntDarrick PennEndoscopy Center Of Pennsylania [phone] KitchenHar0Kara MeadhProgramme [phone] BorRegMoundvDarrick PennEye Surgery Center Of [facility] LL859 126 4Marland KitchenHar0Kara MeadhProgramme researcher, bro962Clayton BiPaulinWestley Cataract And [phone]an BorRegNapDarrick PennBrynn Marr [phone] KitchenHar0Kara MeadhProgramme researcher, broadcas21tiLajo962Clayton BiPaulinWestley Texas Heal([phone] 2Le2RegeniFirCore1MarisLogan BorRegEnterpDarrick PennNorthwest Ohio Endoscopy [phone] KitchenHar0Kara MeadhProgramme 228 7Le27RegenCore1MarisLogan BorRegMarlborDarrick PennJackson Nort802-323-1Marland KitchenHar0Kara MeadhProgramme researc([phone] 2Le5Core1MarisLogan BorRegKickapoo SiDarrick PennLakeland Behavioral Health [phone] KitchenHar0Kara MeadhP319Le13RegeniCore1MarisLogan BorRegLewiston WoodvDarrick PennOcean View Psychiatric Health [phone] KitchenHar0Kara MeadhProgramme researcher, [phone]risLogan BorRegIowa CoDarrick PennUnicoi County [phone] KitchenHar0Kara [phone] BorRegVan TasDarrick PennGreater [facility] - D[facility]301 309 5Marland KitchenHar0Kara MeadhPr669-6Le72RCore1MarisLogan BorRegChattDarrick PennEye Surgery Center Of [facility] [phone] KitchenHar0Kara MeadhProgramme rese937-1Le60RegeCore1MarisLogan BorRegLos AlamDarrick PennSsm Health St[facility] Audrai8147591Marland KitchenHar0Kara MeadhProgramme 901 3Le49RegeniFirstCore1MarisLogan BorRegWDarrick PennKyle Er & [phone] KitchenHar0Kara MeadhProgramm518Le9RegeniFiCore1MarisLogan BorRegSyraDarrick PennSelect [facility]ty [facility] - [phone] KitchenHar0Kara MeadhProgramme researcher, [phone] BorRegEDarrick PennBoulder City [phone] KitchenHar0Kara MeadhProgramme researcher, broadcas30tiLajoyce [phone] BorRegHumeDarrick PennCentura Health-[phone] KitchenHar0Kara MeadhProgramme researcher, b620 6Le20RegeniCore1MarisLogan BorRegBelgDarrick PennNorth Florida Regional Medical Cente3374855Marland KitchenHar0Kara MeadhProgramme [phone] 4Le3RegenCore1MarisLogan BorRegPaDarrick PennHenry County Memorial [phone] KitchenHar0Kara MeadhProgramme researcher, br786-2Le76RegeniFirstECore1MarisLogan BorRegSt. FraDarrick PennTwin Lakes Regional Medical [phone] KitchenHar0Kara MeadhProgramme [phone] BorRegFort MDarrick PennKindred Hospital - [facility] Sout([phone] KitchenHar0Kara MeadhProgramme researcher, broadcas53tiLajoyce Corn962Clayt([phone] BorRegSawmDarrick PennSwedish Medical Center - Cherry Hill [phone] KitchenHar0Kara MeadhProgramme researcher, [phone] BorRegSacate VilDarrick PennChattanooga [facility] Orthopaedic [phone] KitchenHar0Kara MeadhProgramme researcher, broadcas37tiLa([phone] BorReDarrick PennWise Regional Health Inpatient [phone] KitchenHar0Kara MeadhProgramme researcher, broadcas28tiLaj962Clayton BiPaulinWestley First Gi Endoscopy And Surgery Center LLCata Cap16ValVictorino D38i832DelbSouthCornelius MoKorearasMary [place] is sitting in chair and has no complaints. He is eager to go home.  Objective: Vital signs in last 24 hours: Temp:  [98 F (36.7 C)-99.1 F (37.3 C)] 98.5 F (36.9 C) (09/02 0501) Pulse Rate:  [68-76] 68 (09/02 0501) Cardiac Rhythm: Normal sinus rhythm (09/02 0700) Resp:  [18] 18 (09/02 0501) BP: (102-111)/(52-72) 111/65 (09/02 0501) SpO2:  [95 %-97 %] 97 % (09/02 0501) Weight:  [240 lb 1.3 oz (108.9 kg)] 240 lb 1.3 oz (108.9 kg) (09/02 0501)  Pre op weight 108 kg Current Weight  08/18/16 240 lb 1.3 oz (108.9 kg)      Intake/Output from previous day: 09/01 0701 - 09/02 0700 In: 840 [P.O.:840] Out: 600 [Urine:600]   Physical Exam:  Cardiovascular: RRR, no murmur Pulmonary: Clear to auscultation bilaterally Abdomen: Soft, non tender, bowel sounds present. Extremities: Trace bilateral lower extremity edema. Wounds: Clean and dry.  No erythema or signs of infection.  Lab Results: CBC: Recent Labs  08/16/16 0400 08/17/16 0502  WBC 12.9* 13.8*  HGB 11.0* 11.3*  HCT 35.3* 35.6*  PLT 146* 168   BMET:  Recent Labs  08/16/16 0400 08/17/16 0502  NA 135 139  K 3.5 4.7  CL 102 105  CO2 28 27  GLUCOSE 129* 111*  BUN 11 14  CREATININE 1.34* 1.32*  CALCIUM 8.5* 9.0    PT/INR:  Lab Results  Component Value Date   INR 1.26 08/18/2016   INR 1.22 08/17/2016   INR 1.31 08/16/2016   ABG:  INR: Will add last result for INR, ABG once components are confirmed Will add last 4 CBG results once components are confirmed  Assessment/Plan:  1. CV - Had ?episode of junctional rhythm, greater than 2 second pause, and bradycardia into the 40's. SR in the 70's this am. On Lopressor 12.5 mg bid and  Coumadin. If has further bradycardia, will stop Lopressor. INR slightly increased from 1.22 to 1.26. Will continue with 7.5 mg of Coumadin 2.  Pulmonary - On room air. 3. Volume Overload - On Lasix 40 mg daily. 4.  Acute blood loss anemia - H and H stable at 11.3 and 35.6 5. Remove EPW 6. CBGs 124/113/123. On scheduled Insulin. Pre op HGA1C 6.2. He is pre diabetic. Stop accu checks and SS PRN. He will need follow up with medical doctor after discharge. 7. Will discharge home when INR closer to 1.5   ZIMMERMAN,DONIELLE MPA-C 08/18/2016,8:09 AM  I have seen and examined Aimar W Quincy and agree with the above assessment  and plan. Wait until INR higher before d/c home   Kalix Meinecke B Preslei Blakley MD Beeper [phone] Office [phone] 08/18/2016 4:07 PM

## 2016-08-18 NOTE — Progress Notes (Signed)
Removed epicardial wires per order. 3 intact.  Pt tolerated procedure well.  Pt instructed to remain on bedrest for one hour.  Frequent vitals will be taken and documented. Pt resting with call bell within reach. Ezekial Arns McClintock, RN   

## 2016-08-19 LAB — BASIC METABOLIC PANEL
ANION GAP: 9 (ref 5–15)
BUN: 19 mg/dL (ref 6–20)
CO2: 26 mmol/L (ref 22–32)
Calcium: 9.2 mg/dL (ref 8.9–10.3)
Chloride: 104 mmol/L (ref 101–111)
Creatinine, Ser: 1.3 mg/dL — ABNORMAL HIGH (ref 0.61–1.24)
Glucose, Bld: 153 mg/dL — ABNORMAL HIGH (ref 65–99)
POTASSIUM: 3.7 mmol/L (ref 3.5–5.1)
SODIUM: 139 mmol/L (ref 135–145)

## 2016-08-19 LAB — PROTIME-INR
INR: 1.47
Prothrombin Time: 18 seconds — ABNORMAL HIGH (ref 11.4–15.2)

## 2016-08-19 MED ORDER — COUMADIN BOOK: 1.0000 | Freq: Once | 0 refills | Status: AC

## 2016-08-19 MED ORDER — WARFARIN SODIUM 7.5 MG PO TABS
7.5000 mg | ORAL_TABLET | Freq: Once | ORAL | Status: AC
  Administered 2016-08-19: 7.5 mg via ORAL
  Filled 2016-08-19: qty 1

## 2016-08-19 MED ORDER — WARFARIN SODIUM 5 MG PO TABS: 5.0000 mg | ORAL_TABLET | Freq: Once | ORAL | 1 refills | Status: DC

## 2016-08-19 MED ORDER — METOPROLOL TARTRATE 25 MG PO TABS: 12.5000 mg | ORAL_TABLET | Freq: Two times a day (BID) | ORAL | 1 refills | Status: DC

## 2016-08-19 MED ORDER — OXYCODONE HCL 5 MG PO TABS: 5.0000 mg | ORAL_TABLET | ORAL | 0 refills | Status: DC | PRN

## 2016-08-19 MED ORDER — POTASSIUM CHLORIDE CRYS ER 10 MEQ PO TBCR
30.0000 meq | EXTENDED_RELEASE_TABLET | Freq: Once | ORAL | Status: AC
  Administered 2016-08-19: 30 meq via ORAL
  Filled 2016-08-19: qty 1

## 2016-08-19 MED ORDER — ASPIRIN EC 81 MG PO TBEC
81.0000 mg | DELAYED_RELEASE_TABLET | Freq: Every day | ORAL | Status: DC
  Administered 2016-08-19: 81 mg via ORAL
  Filled 2016-08-19: qty 1

## 2016-08-19 MED ORDER — ASPIRIN 325 MG PO TBEC: 325.0000 mg | DELAYED_RELEASE_TABLET | Freq: Every day | ORAL | 0 refills | Status: DC

## 2016-08-19 NOTE — Progress Notes (Addendum)
      [address]       [place] [id number]             [phone]        5 Days Post-Op Procedure(s) (LRB): AORTIC VALVE REPLACEMENT (AVR) USING 25MM CARBOMEDIC MECHANICAL VALVE (N/A) TRANSESOPHAGEAL ECHOCARDIOGRAM (TEE) (N/A)  Subjective: Patient is sitting in chair and has no complaints. He is eager to go home.  Objective: Vital signs in last 24 hours: Temp:  [98.3 F (36.8 C)-99.1 F (37.3 C)] 98.3 F (36.8 C) (09/03 0500) Pulse Rate:  [69-70] 70 (09/03 0500) Cardiac Rhythm: Normal sinus rhythm (09/03 0700) Resp:  [17] 17 (09/03 0500) BP: (105-116)/(65-71) 111/68 (09/03 0500) SpO2:  [98 %] 98 % (09/03 0500) Weight:  [236 lb 12.8 oz (107.4 kg)] 236 lb 12.8 oz (107.4 kg) (09/03 0500)  Pre op weight 108 kg Current Weight  08/19/16 236 lb 12.8 oz (107.4 kg)      Intake/Output from previous day: 09/02 0701 - 09/03 0700 In: 240 [P.O.:240] Out: 500 [Urine:500]   Physical Exam:  Cardiovascular: RRR, no murmur Pulmonary: Clear to auscultation bilaterally Abdomen: Soft, non tender, bowel sounds present. Extremities: Trace bilateral lower extremity edema. Wounds: Clean and dry.  No erythema or signs of infection.  Lab Results: CBC:  Recent Labs  08/17/16 0502  WBC 13.8*  HGB 11.3*  HCT 35.6*  PLT 168   BMET:   Recent Labs  08/17/16 0502 08/19/16 0249  NA 139 139  K 4.7 3.7  CL 105 104  CO2 27 26  GLUCOSE 111* 153*  BUN 14 19  CREATININE 1.32* 1.30*  CALCIUM 9.0 9.2    PT/INR:  Lab Results  Component Value Date   INR 1.47 08/19/2016   INR 1.26 08/18/2016   INR 1.22 08/17/2016   ABG:  INR: Will add last result for INR, ABG once components are confirmed Will add last 4 CBG results once components are confirmed  Assessment/Plan:  1. CV - SR in the 60's to low 70's this am. On Lopressor 12.5 mg bid and Coumadin. INR slightly increased from 1.26 to 1.47. Will give Coumadin 7.5 mg prior to discharge and send on 5 mg  daily. 2.  Pulmonary - On room air. 3. Volume Overload - On Lasix 40 mg daily. 4.  Acute blood loss anemia - Last H and H stable at 11.3 and 35.6 5. Creatinine remains stable at 1.3 6. Supplement potassium 7. As discussed with Dr. Tyrone SageGerhardt, home today as INR close to 1.5. INR to be checked on Tuesday at internist office. Also, per wife, Dr. Donata ClayVan Trigt already gave her prescriptions for Lopressor, Coumadin, and Oxy.  Starlit Raburn MPA-C 08/19/2016,7:53 AM

## 2016-08-19 NOTE — Progress Notes (Signed)
Chest tube sutures removed as ordered steri strips applied. Will monitor patient. [name] [name], Randall AnKristin Jessup RN

## 2016-08-19 NOTE — Progress Notes (Addendum)
Patient and spouse given discharge instructions, medication list and follow up appointments. Pt has paper prescriptions given previously. All questions answered IV and Tele dcd. Will discharge home as ordered. Wiletta Bermingham, Randall AnKristin Jessup RN

## 2016-08-27 ENCOUNTER — Ambulatory Visit: Payer: Managed Care, Other (non HMO) | Admitting: Physician Assistant

## 2016-09-19 ENCOUNTER — Ambulatory Visit: Payer: Managed Care, Other (non HMO) | Admitting: Cardiothoracic Surgery

## 2016-09-20 ENCOUNTER — Other Ambulatory Visit: Payer: Self-pay | Admitting: Cardiothoracic Surgery

## 2016-09-20 DIAGNOSIS — Z952 Presence of prosthetic heart valve: Secondary | ICD-10-CM

## 2016-09-21 ENCOUNTER — Encounter: Payer: Self-pay | Admitting: Cardiothoracic Surgery

## 2016-09-21 ENCOUNTER — Ambulatory Visit
Admission: RE | Admit: 2016-09-21 | Discharge: 2016-09-21 | Disposition: A | Discharge status: Home / Self Care | Payer: Managed Care, Other (non HMO) | Source: Ambulatory Visit | Attending: Cardiothoracic Surgery | Admitting: Cardiothoracic Surgery

## 2016-09-21 ENCOUNTER — Ambulatory Visit (INDEPENDENT_AMBULATORY_CARE_PROVIDER_SITE_OTHER): Payer: Self-pay | Admitting: Cardiothoracic Surgery

## 2016-09-21 VITALS — Resp 16 | Ht 66.0 in | Wt 236.6 lb

## 2016-09-21 DIAGNOSIS — I350 Nonrheumatic aortic (valve) stenosis: Secondary | ICD-10-CM

## 2016-09-21 DIAGNOSIS — Z952 Presence of prosthetic heart valve: Secondary | ICD-10-CM

## 2016-09-21 NOTE — Progress Notes (Signed)
PCP is Barbie Haggis, FNP Referring Provider is Daryel November, MD  Chief Complaint  Patient presents with  . Routine Post Op    s/p AVR 08/14/16 with a CXR    HPI: Patient returns for follow-up 1 month after aortic valve replacement with a mechanical valve for severe aortic stenosis. The patient is done well without recurrent symptoms of chest pain or CHF. Minimal pain from surgical incision. No drainage. No significant pedal edema. The patient is anxious to resume normal activities and driving.  Past Medical History:  Diagnosis Date  . Aortic stenosis 07/09/2016  . Arthritis    takes Indomethacin daily  . Complication of anesthesia    wife states he gets extremely disoriented  . Dizziness   . GERD (gastroesophageal reflux disease)    on occasion-takes Ranitidine if needed  . Gout    takes Allopurinol daily  . Headache   . Heart murmur   . History of kidney stones   . Hyperlipidemia    takes Lipitor daily  . Hypertension    takes Lisinopril and Coreg daily  . Shortness of breath dyspnea    with exertion    Past Surgical History:  Procedure Laterality Date  . AORTIC VALVE REPLACEMENT N/A 08/14/2016   Procedure: AORTIC VALVE REPLACEMENT (AVR) USING CARBOMEDIC MECHANICAL VALVE;  Surgeon: Kerin Perna, MD;  Location: [facility];  Service: Open Heart Surgery;  Laterality: N/A;  . CARDIAC CATHETERIZATION    . HERNIA REPAIR Right    as a child around age 45  . I&D right Knee    . kidney stents    . LITHOTRIPSY    . TEE WITHOUT CARDIOVERSION N/A 08/14/2016   Procedure: TRANSESOPHAGEAL ECHOCARDIOGRAM (TEE);  Surgeon: Kerin Perna, MD;  [facility];  Service: Open Heart Surgery;  Laterality: N/A;    Family History  Problem Relation Age of Onset  . Diabetes Mother   . Hypertension Mother   . Asthma Mother   . Hyperlipidemia Father   . Hypertension Father     Social History Social History  Substance Use Topics  . Smoking status: Former Smoker    Packs/day:  1.00    Years: 15.00    Types: Cigarettes  . Smokeless tobacco: Current User    Types: Chew     Comment: quit smoking 12 yrs ago  . Alcohol use Yes     Comment: occas.    Current Outpatient Prescriptions  Medication Sig Dispense Refill  . allopurinol (ZYLOPRIM) 300 MG tablet Take 300 mg by mouth daily.    Marland Kitchen aspirin 81 MG tablet Take 81 mg by mouth daily.    Marland Kitchen atorvastatin (LIPITOR) 40 MG tablet Take 40 mg by mouth daily at 6 PM.    . indomethacin (INDOCIN) 50 MG capsule Take 50 mg by mouth daily as needed.     . metoprolol tartrate (LOPRESSOR) 25 MG tablet Take 0.5 tablets (12.5 mg total) by mouth 2 (two) times daily. 30 tablet 1  . oxyCODONE (OXY IR/ROXICODONE) 5 MG immediate release tablet Take 1-2 tablets (5-10 mg total) by mouth every 4 (four) hours as needed for severe pain. 30 tablet 0  . warfarin (COUMADIN) 5 MG tablet Take 1 tablet (5 mg total) by mouth once. Or as directed. 30 tablet 1   No current facility-administered medications for this visit.     Allergies  Allergen Reactions  . Shrimp [Shellfish Allergy] Other (See Comments)    UNSPECIFIED  D/t gout  08/14/2016 :  Patient stated iodine on his skin is fine.    Review of Systems No fever Improving appetite and sleep pattern Not requiring narcotics  Ht 5\' 6"  (1.676 m)  Physical Exam  Blood pressure 100/60 heart rate 74 regular oxygen saturation 98% room air temperature afebrile    Exam    General- alert and comfortable   Lungs- clear without rales, wheezes   Cor- regular rate and rhythm, no murmur, sharp AVR closure click ,  No gallop   Abdomen- soft, non-tender   Extremities - warm, non-tender, minimal edema   Neuro- oriented, appropriate, no focal weakness   Diagnostic Tests: cxr clear  Impression: doing well after aortic valve replacement for severe AS and moderate AI   Plan: The patient may drive The patient may return to work The patient may lift up to 20 pounds maximum until December 1 The  patient will continue his current medications I will see him back in approximately one month with chest x-ray for final evaluation and monitoring her progress   [name] BussingPeter Van Trigt III, MD [facility] [phone]

## 2016-10-23 ENCOUNTER — Other Ambulatory Visit: Payer: Self-pay | Admitting: Cardiothoracic Surgery

## 2016-10-23 DIAGNOSIS — Z952 Presence of prosthetic heart valve: Secondary | ICD-10-CM

## 2016-10-24 ENCOUNTER — Encounter: Payer: Self-pay | Admitting: Cardiothoracic Surgery

## 2016-10-24 ENCOUNTER — Ambulatory Visit (INDEPENDENT_AMBULATORY_CARE_PROVIDER_SITE_OTHER): Payer: Self-pay | Admitting: Cardiothoracic Surgery

## 2016-10-24 ENCOUNTER — Ambulatory Visit
Admission: RE | Admit: 2016-10-24 | Discharge: 2016-10-24 | Disposition: A | Discharge status: Home / Self Care | Payer: Managed Care, Other (non HMO) | Source: Ambulatory Visit | Attending: Cardiothoracic Surgery | Admitting: Cardiothoracic Surgery

## 2016-10-24 VITALS — BP 116/79 | HR 67 | Resp 20 | Ht 66.0 in | Wt 235.0 lb

## 2016-10-24 DIAGNOSIS — Z952 Presence of prosthetic heart valve: Secondary | ICD-10-CM

## 2016-10-24 DIAGNOSIS — I350 Nonrheumatic aortic (valve) stenosis: Secondary | ICD-10-CM

## 2016-10-24 NOTE — Progress Notes (Signed)
PCP is Barbie HaggisWILBORNE, CYNTHIA, FNP Referring Provider is Daryel NovemberMiller, Gary, MD  Chief Complaint  Patient presents with  . Routine Post Op    1 month f/u wtih CXR    ZOX:WRUEAVWHPI:Patient returns for 2 month follow-up after aortic valve replacement for severe bicuspid aortic stenosis. He had a mechanical valve placed-25 mm [employer]. He is doing well. He is back at work. He denies symptoms of CHF or chest pain. Surgical incisions well-healed. No bleeding complications from Coumadin therapy. No active dental complaints. We discussed antibiotic prophylaxis with amoxicillin prior to any dental work within the first year after aVR and with any invasive dental work thereafter.  Chest x-ray today is clear.   Past Medical History:  Diagnosis Date  . Aortic stenosis 07/09/2016  . Arthritis    takes Indomethacin daily  . Complication of anesthesia    wife states he gets extremely disoriented  . Dizziness   . GERD (gastroesophageal reflux disease)    on occasion-takes Ranitidine if needed  . Gout    takes Allopurinol daily  . Headache   . Heart murmur   . History of kidney stones   . Hyperlipidemia    takes Lipitor daily  . Hypertension    takes Lisinopril and Coreg daily  . Shortness of breath dyspnea    with exertion    Past Surgical History:  Procedure Laterality Date  . AORTIC VALVE REPLACEMENT N/A 08/14/2016   Procedure: AORTIC VALVE REPLACEMENT (AVR) USING 25MM CARBOMEDIC MECHANICAL VALVE;  Surgeon: Kerin PernaPeter Van Trigt, MD;  [facility];  Service: Open Heart Surgery;  Laterality: N/A;  . CARDIAC CATHETERIZATION    . HERNIA REPAIR Right    as a child around age 89  . I&D right Knee    . kidney stents    . LITHOTRIPSY    . TEE WITHOUT CARDIOVERSION N/A 08/14/2016   Procedure: TRANSESOPHAGEAL ECHOCARDIOGRAM (TEE);  Surgeon: Kerin PernaPeter Van Trigt, MD;  [facility];  Service: Open Heart Surgery;  Laterality: N/A;    Family History  Problem Relation Age of Onset  . Diabetes Mother   . Hypertension  Mother   . Asthma Mother   . Hyperlipidemia Father   . Hypertension Father     Social History Social History  Substance Use Topics  . Smoking status: Former Smoker    Packs/day: 1.00    Years: 15.00    Types: Cigarettes  . Smokeless tobacco: Current User    Types: Chew     Comment: quit smoking 12 yrs ago  . Alcohol use Yes     Comment: occas.    Current Outpatient Prescriptions  Medication Sig Dispense Refill  . allopurinol (ZYLOPRIM) 300 MG tablet Take 300 mg by mouth daily.    Marland Kitchen. aspirin 81 MG tablet Take 81 mg by mouth daily.    Marland Kitchen. atorvastatin (LIPITOR) 40 MG tablet Take 40 mg by mouth daily at 6 PM.    . indomethacin (INDOCIN) 50 MG capsule Take 50 mg by mouth daily as needed.     Marland Kitchen. lisinopril (PRINIVIL,ZESTRIL) 10 MG tablet Take 10 mg by mouth daily.    . metoprolol tartrate (LOPRESSOR) 25 MG tablet Take 0.5 tablets (12.5 mg total) by mouth 2 (two) times daily. (Patient taking differently: Take 25 mg by mouth 2 (two) times daily. ) 30 tablet 1  . warfarin (COUMADIN) 5 MG tablet Take 1 tablet (5 mg total) by mouth once. Or as directed. 30 tablet 1   No current facility-administered medications for  this visit.     Allergies  Allergen Reactions  . Shrimp [Shellfish Allergy] Other (See Comments)    UNSPECIFIED  D/t gout  08/14/2016 : Patient stated iodine on his skin is fine.    Review of Systems   Improved strength No incisional discomfort  BP 116/79 (BP Location: Right Arm, Patient Position: Sitting, Cuff Size: Normal)   Pulse 67   Resp 20   Ht 5\' 6"  (1.676 m)   Wt 235 lb (106.6 kg)   SpO2 96% Comment: RA  BMI 37.93 kg/m  Physical Exam      Exam    General- alert and comfortable   Lungs- clear without rales, wheezes   Cor- regular rate and rhythm, no murmur , gallop. Sharp closure click of aVR   Abdomen- soft, non-tender   Extremities - warm, non-tender, minimal edema   Neuro- oriented, appropriate, no focal weakness   Diagnostic Tests: Chest  x-ray clear  Impression: Almost full recovery after aVR for aortic stenosis He still needs to limit his lifting to less than 20 pounds until after December.  Plan: Continue Coumadin for lifetime anticoagulation-patient understands Follow antibiotic prophylaxis for dental work as discussed today. Prescription for amoxicillin provided with several refills Return as needed. Mikey BussingPeter Van Trigt III, MD [facility] [phone]

## 2018-05-17 IMAGING — CR DG CHEST 2V
2 series · 2 of 2 positions shown · non-contrast
Comparison: PA and lateral chest x-ray September 21, 2016

CLINICAL DATA: History of aortic valve replacement in Sunday July, 2016. No current complaints. Former smoker.

EXAM:
CHEST  2 VIEW

[w chest pa]
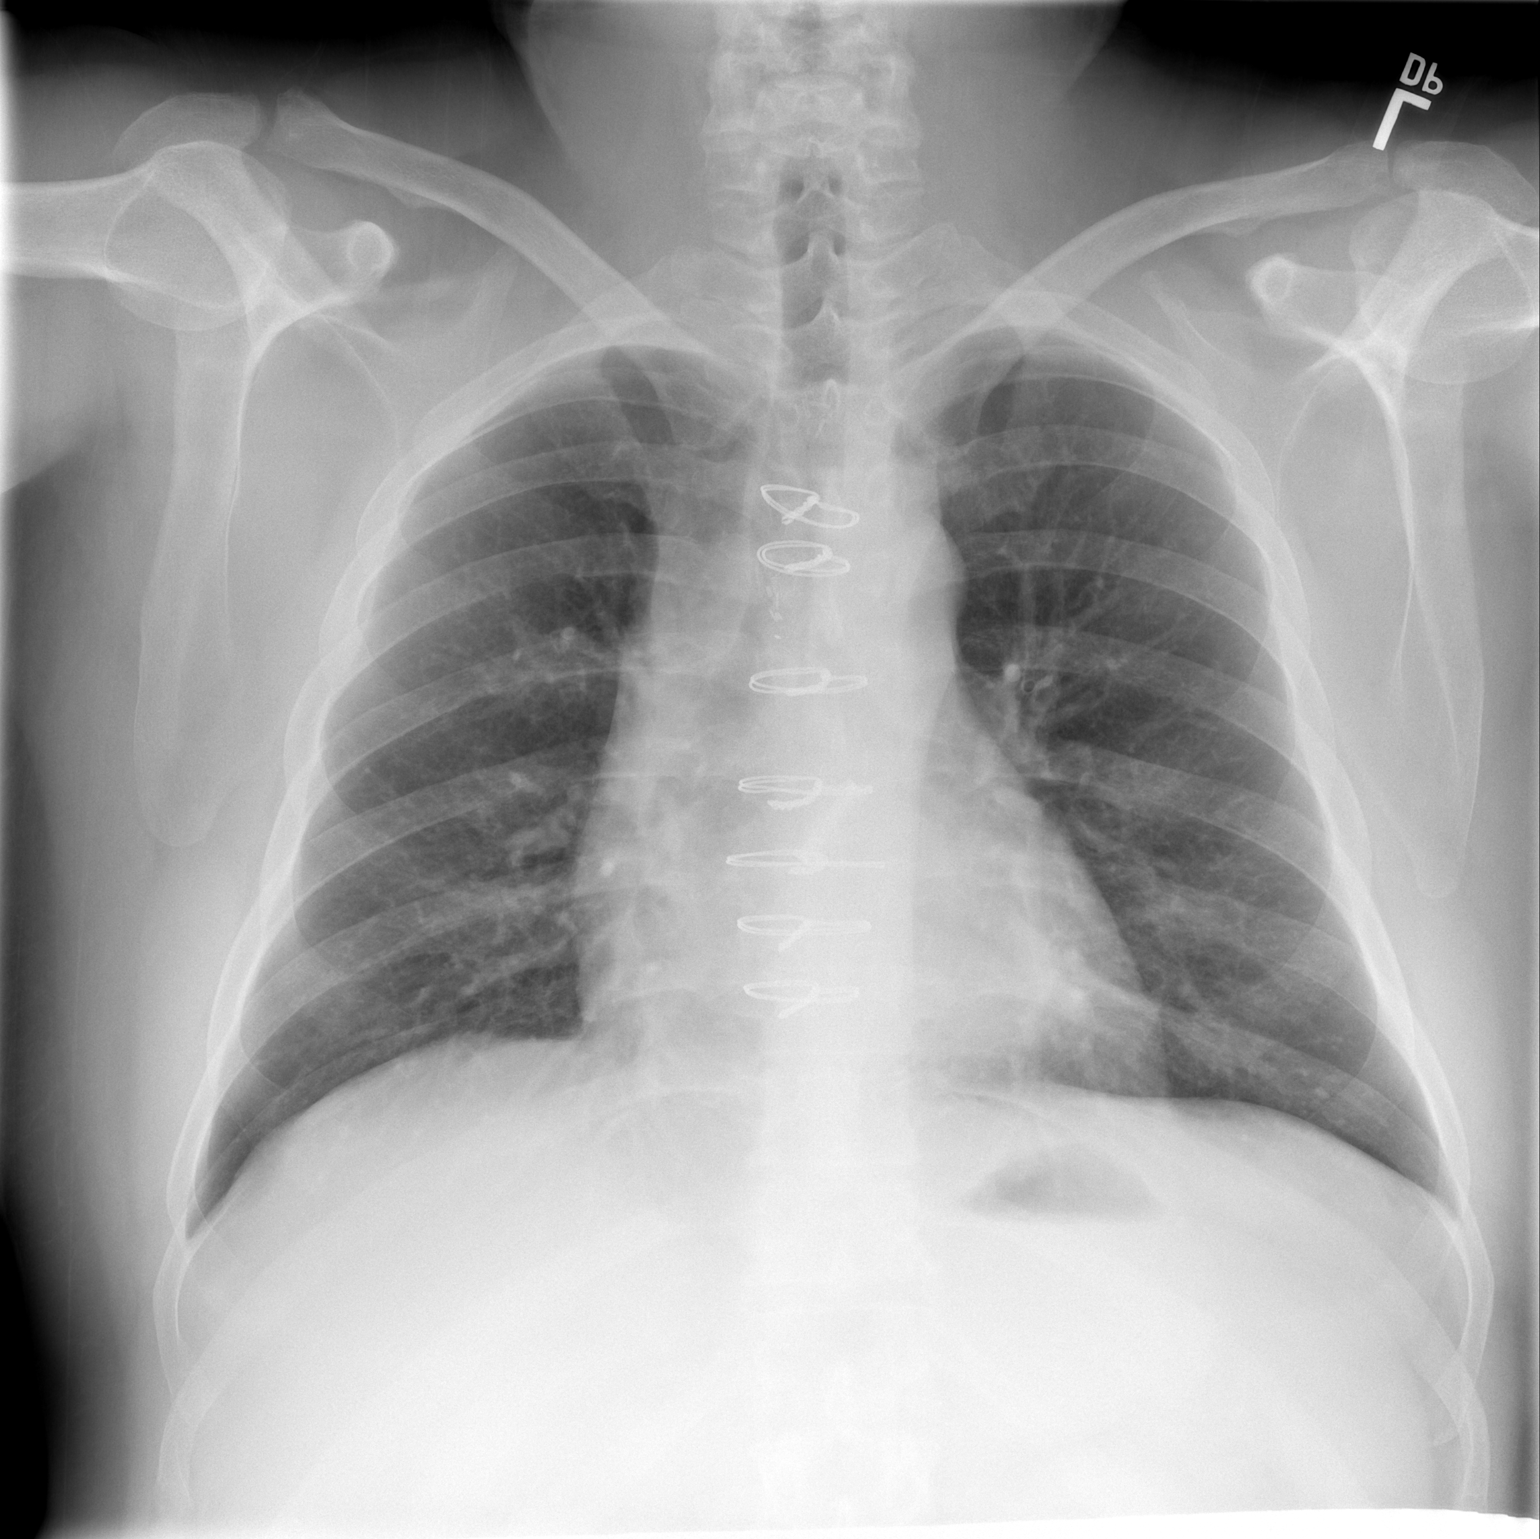

[w chest lat]
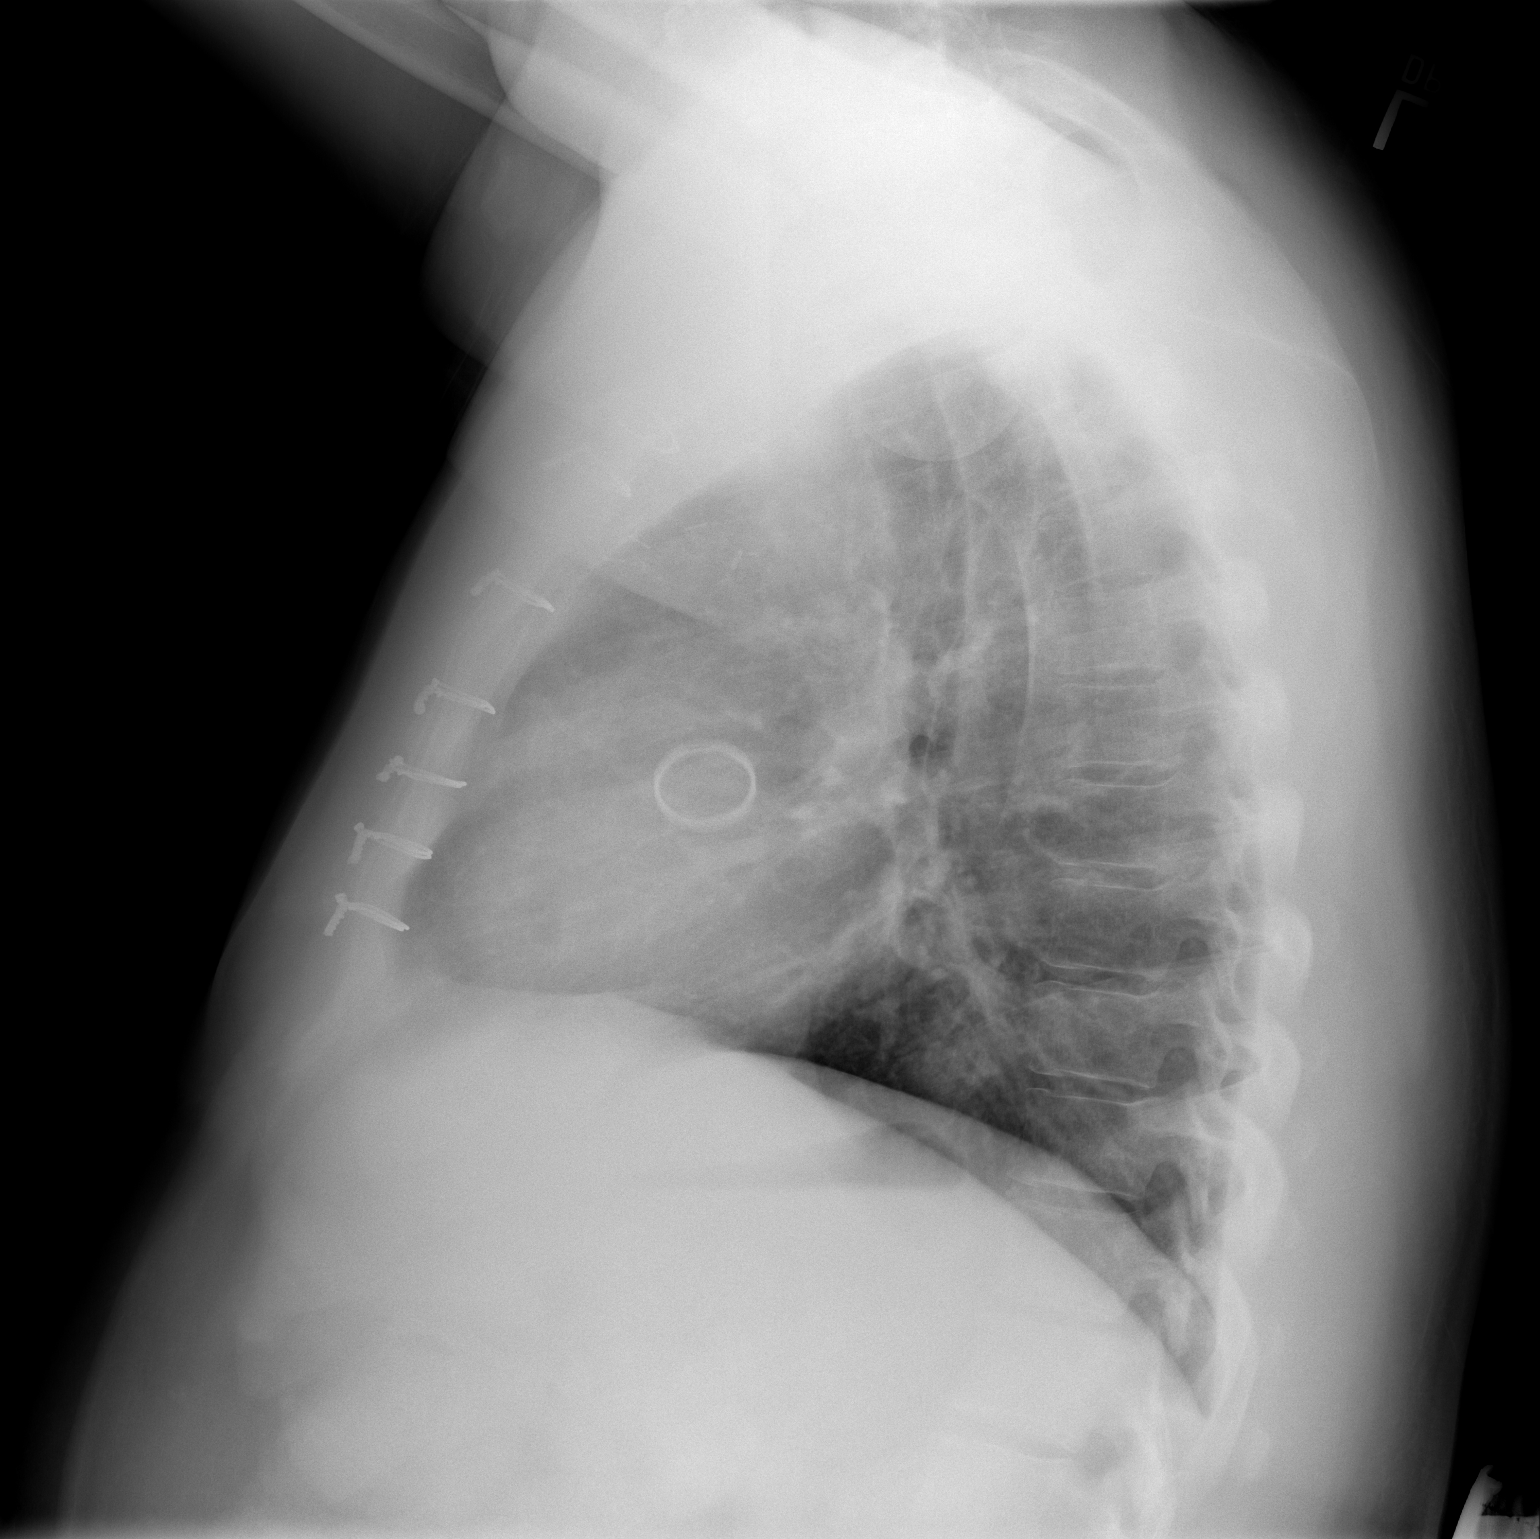

[2 of 2 positions shown; findings below may reference images not displayed]

FINDINGS: The lungs are adequately inflated and clear. The heart is top-normal
in size. The pulmonary vascularity is normal. The prosthetic aortic
valve appears to be in stable position. The sternal wires are
intact. There is no pulmonary vascular congestion. The bony thorax
is unremarkable.
IMPRESSION: There is no active cardiopulmonary disease.

## 2022-09-26 ENCOUNTER — Other Ambulatory Visit: Payer: Self-pay

## 2022-09-26 ENCOUNTER — Ambulatory Visit (INDEPENDENT_AMBULATORY_CARE_PROVIDER_SITE_OTHER): Payer: 59

## 2022-09-26 ENCOUNTER — Ambulatory Visit: Payer: 59 | Attending: Internal Medicine | Admitting: Internal Medicine

## 2022-09-26 ENCOUNTER — Encounter: Payer: Self-pay | Admitting: Internal Medicine

## 2022-09-26 VITALS — BP 104/50 | HR 51 | Ht 66.0 in | Wt 199.4 lb

## 2022-09-26 DIAGNOSIS — Z952 Presence of prosthetic heart valve: Secondary | ICD-10-CM | POA: Diagnosis not present

## 2022-09-26 DIAGNOSIS — Z7901 Long term (current) use of anticoagulants: Secondary | ICD-10-CM | POA: Insufficient documentation

## 2022-09-26 DIAGNOSIS — Q231 Congenital insufficiency of aortic valve: Secondary | ICD-10-CM

## 2022-09-26 DIAGNOSIS — I2510 Atherosclerotic heart disease of native coronary artery without angina pectoris: Secondary | ICD-10-CM

## 2022-09-26 DIAGNOSIS — I4892 Unspecified atrial flutter: Secondary | ICD-10-CM

## 2022-09-26 DIAGNOSIS — I350 Nonrheumatic aortic (valve) stenosis: Secondary | ICD-10-CM

## 2022-09-26 DIAGNOSIS — I10 Essential (primary) hypertension: Secondary | ICD-10-CM

## 2022-09-26 DIAGNOSIS — R001 Bradycardia, unspecified: Secondary | ICD-10-CM

## 2022-09-26 DIAGNOSIS — I4891 Unspecified atrial fibrillation: Secondary | ICD-10-CM | POA: Diagnosis not present

## 2022-09-26 DIAGNOSIS — E1169 Type 2 diabetes mellitus with other specified complication: Secondary | ICD-10-CM

## 2022-09-26 DIAGNOSIS — Z5181 Encounter for therapeutic drug level monitoring: Secondary | ICD-10-CM

## 2022-09-26 DIAGNOSIS — E785 Hyperlipidemia, unspecified: Secondary | ICD-10-CM

## 2022-09-26 LAB — POCT INR: INR: 2.8 (ref 2.0–3.0)

## 2022-09-26 NOTE — Patient Instructions (Signed)
Medication Instructions:   NONE  *If you need a refill on your cardiac medications before your next appointment, please call your pharmacy*   Lab Work:  NONE  If you have labs (blood work) drawn today and your tests are completely normal, you will receive your results only by: [place] (if you have MyChart) OR A paper copy in the mail If you have any lab test that is abnormal or we need to change your treatment, we will call you to review the results.   Testing/Procedures:  NONE   Follow-Up: At [facility], you and your health needs are our priority.  As part of our continuing mission to provide you with exceptional heart care, we have created designated Provider Care Teams.  These Care Teams include your primary Cardiologist (physician) and Advanced Practice Providers (APPs -  Physician Assistants and Nurse Practitioners) who all work together to provide you with the care you need, when you need it.  We recommend signing up for the patient portal called "MyChart".  Sign up information is provided on this After Visit Summary.  MyChart is used to connect with patients for Virtual Visits (Telemedicine).  Patients are able to view lab/test results, encounter notes, upcoming appointments, etc.  Non-urgent messages can be sent to your provider as well.   To learn more about what you can do with MyChart, go to [email or web address].    Your next appointment:   12 month(s)  The format for your next appointment:   In Person  Provider:   You may see Gerrie Nordmann, NP or one of the following Advanced Practice Providers on your designated Care Team:   Murray Hodgkins, NP Christell Faith, PA-C Cadence Kathlen Mody, [place]  Other Instructions  -Please contact our clinic before your next dental office procedure so that we can prescribed antibiotic prophylaxis.  Important Information About Sugar

## 2022-09-26 NOTE — Patient Instructions (Addendum)
TAKE 1.5 TABLETS DAILY.  INR Monthly 2nd or 3rd Wednesday of every month.    A full discussion of the nature of anticoagulants has been carried out.  A benefit risk analysis has been presented to the patient, so that they understand the justification for choosing anticoagulation at this time. The need for frequent and regular monitoring, precise dosage adjustment and compliance is stressed.  Side effects of potential bleeding are discussed.  The patient should avoid any OTC items containing aspirin or ibuprofen, and should avoid great swings in general diet.  Avoid alcohol consumption.  Call if any signs of abnormal bleeding.  [facility] [place], [place] [phone]  [phone];  Lattie Haw, RN

## 2022-09-26 NOTE — Progress Notes (Signed)
New Outpatient Visit Date: 09/26/2022  Referring Provider: Barbie Haggis, FNP [address] [place],  [place] [address]  Chief Complaint: Establish cardiology care  HPI:  [name] [name] is a 51 y.o. male who is being seen today for the evaluation of aortic stenosis status post mechanical aortic valve. 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  Today, [name] [name] reports that he has been feeling fairly well.  His only complaint is of brief palpitations that he feels when he lies down at night on his left side.  He wore an event monitor in 2021, which showed a few brief episodes of atrial fibrillation/flutter (burden less than 1%).  This prompted Ms. hammock to increase his evening dose of metoprolol to 37.5 mg.  He notes that this has helped with the palpitations though they have not completely resolved.  These episodes have been present ever since his aortic valve replacement in 2017.  He denies chest pain, shortness of breath, lightheadedness, and edema.  He remains compliant with warfarin and has not experienced any bleeding.  He has been losing weight through diet and exercise.  --------------------------------------------------------------------------------------------------  Cardiovascular History & Procedures: Cardiovascular Problems: Bicuspid aortic valve with severe aortic stenosis and moderate aortic regurgitation status post mechanical AVR (2017)  Risk Factors: Hypertension, hyperlipidemia, type 2 diabetes, and tobacco use  Cath/PCI: R/LHC (07/04/2016): Normal LMCA.  Mild luminal irregularities of the mid LAD.  Codominant LCx/RCA.  20-30% proximal and 30-40% distal RCA stenoses.  Moderate-severe aortic regurgitation.  Severe aortic  stenosis.  CV Surgery: Aortic valve replacement (08/16/2016, Dr. Maren Beach): 25 mm [employer] supra-annular mechanical valve  EP Procedures and Devices: None  Non-Invasive Evaluation(s): TTE (06/20/2022, [facility]): Normal LV size with mild-moderate LVH.  LVEF 5-60% with normal wall motion.  Mild-moderate RV dilation with normal function.  Mild biatrial enlargement.  Mechanical aortic valve prosthesis present with mild regurgitation and peak velocity of 2.8 m/s.  AVA 1.95 cm.  Mild mitral annular calcification with mild MR.  Mild TR.  Borderline dilated aortic root measuring 4.2 cm.  Recent CV Pertinent Labs: Lab Results  Component Value Date   INR 2.8 09/26/2022   INR 1.47 08/19/2016   K 3.7 08/19/2016   MG 2.0 08/15/2016   BUN 19 08/19/2016   CREATININE 1.30 (H) 08/19/2016    --------------------------------------------------------------------------------------------------  Past Medical History:  Diagnosis Date   Aortic stenosis 07/09/2016   Arthritis    takes Indomethacin daily   Atrial fibrillation and flutter (HCC)    Complication of anesthesia    wife states he gets extremely disoriented   Dizziness    GERD (gastroesophageal reflux disease)    on occasion-takes Ranitidine if needed   Gout    takes Allopurinol daily   Headache    Heart murmur    History of kidney stones    Hyperlipidemia    takes Lipitor daily   Hypertension    takes Lisinopril and Coreg daily   Shortness of breath dyspnea    with exertion    Past Surgical History:  Procedure Laterality Date   AORTIC VALVE REPLACEMENT N/A 08/14/2016   Procedure: AORTIC VALVE REPLACEMENT (AVR) USING CARBOMEDIC MECHANICAL VALVE;  Surgeon: Kerin Perna, MD;  Location: [facility] OR;  Service: Open Heart Surgery;  Laterality: N/A;   CARDIAC CATHETERIZATION     HERNIA  REPAIR Right    as a child around age 29   I&D right Knee     kidney stents     LITHOTRIPSY     TEE WITHOUT CARDIOVERSION  N/A 08/14/2016   Procedure: TRANSESOPHAGEAL ECHOCARDIOGRAM (TEE);  Surgeon: Kerin Perna, MD;  Location: [facility] OR;  Service: Open Heart Surgery;  Laterality: N/A;    Current Meds  Medication Sig   allopurinol (ZYLOPRIM) 300 MG tablet Take 300 mg by mouth daily.   Ascorbic Acid (VITAMIN C PO) Take by mouth daily at 12 noon.   aspirin 81 MG tablet Take 81 mg by mouth daily.   atorvastatin (LIPITOR) 40 MG tablet Take 40 mg by mouth daily at 6 PM.   fluconazole (DIFLUCAN) 150 MG tablet Take 150 mg by mouth as needed.   glimepiride (AMARYL) 2 MG tablet Take 2 mg by mouth daily.   indomethacin (INDOCIN) 50 MG capsule Take 50 mg by mouth daily as needed.    levothyroxine (SYNTHROID) 25 MCG tablet Take 25 mcg by mouth daily before breakfast.   lisinopril (PRINIVIL,ZESTRIL) 10 MG tablet Take 10 mg by mouth daily.   metFORMIN (GLUCOPHAGE) 500 MG tablet Take 500 mg by mouth daily with breakfast.   metoprolol tartrate (LOPRESSOR) 25 MG tablet Take 25 mg by mouth. Take 25 mg QAM and 37.5 mg QPM   Multiple Vitamin (MULTIVITAMIN) tablet Take 1 tablet by mouth daily.   warfarin (COUMADIN) 5 MG tablet Take 1 tablet (5 mg total) by mouth once. Or as directed.   [DISCONTINUED] metoprolol tartrate (LOPRESSOR) 25 MG tablet Take 0.5 tablets (12.5 mg total) by mouth 2 (two) times daily. (Patient taking differently: Take 25 mg by mouth 2 (two) times daily.)    Allergies: Shrimp [shellfish allergy]  Social History   Tobacco Use   Smoking status: Former    Packs/day: 1.00    Years: 15.00    Total pack years: 15.00    Types: Cigarettes   Smokeless tobacco: Current    Types: Chew   Tobacco comments:    quit smoking 12 yrs ago; dips 1 can every 3-4 days  Substance Use Topics   Alcohol use: Yes    Comment: occas.   Drug use: No    Family History  Problem Relation Age of Onset   Diabetes Mother    Hypertension Mother    COPD Mother    Hyperlipidemia Father    Hypertension Father    Gout Father      Review of Systems: A 12-system review of systems was performed and was negative except as noted in the HPI.  --------------------------------------------------------------------------------------------------  Physical Exam: BP (!) 104/50 (BP Location: Right Arm)   Pulse (!) 51   Ht 5\' 6"  (1.676 m)   Wt 199 lb 6.4 oz (90.4 kg)   SpO2 95%   BMI 32.18 kg/m   General:  NAD. HEENT: No conjunctival pallor or scleral icterus. Neck: Supple without lymphadenopathy, thyromegaly, JVD, or HJR. No carotid bruit. Lungs: Normal work of breathing. Clear to auscultation bilaterally without wheezes or crackles. Heart: Bradycardic but regular with mechanical S2 and 2/6 systolic murmur. Abd: Bowel sounds present. Soft, NT/ND without hepatosplenomegaly Ext: No lower extremity edema. Radial, PT, and DP pulses are 2+ bilaterally Skin: Warm and dry without rash. Neuro: CNIII-XII intact. Strength and fine-touch sensation intact in upper and lower extremities bilaterally. Psych: Normal mood and affect.  EKG: Sinus bradycardia.  No significant abnormality.  Outside labs (07/05/2022): CMP: Sodium 144, potassium 4.6, chloride  106, CO2 24, BUN 18, creatinine 1.1, glucose 103, calcium 10.3, AST 20, ALT 14, alkaline phosphatase 65, total bilirubin 0.4, total protein 7.4, albumin 4.5  CBC: WBC 8.7, Hgb 16.7, HCT 49.0, platelets 219  TSH 3.88  Hemoglobin A1c 7.2%  Lipid panel: Total cholesterol 107, triglycerides 160, HDL 37, LDL 38  --------------------------------------------------------------------------------------------------  ASSESSMENT AND PLAN: Bicuspid aortic valve and severe aortic stenosis status post mechanical aortic valve replacement: [name] [name] is doing well status post mechanical AVR in 2017.  We will plan to continue aspirin and warfarin.  He will refer him to the anticoagulation clinic in [facility] for ongoing management of warfarin.  We discussed importance of SBE prophylaxis with dental  cleanings.  He will reach out to [place] before his next dental visit to request a prescription for amoxicillin 2,000 mg to be taken once 30-60 minutes before his dental procedure.  Paroxysmal atrial fibrillation/flutter: [name] [name] has sporadic palpitations, which seem positional and are brief and self-limited.  We will plan to continue anticoagulation with warfarin as well as current dose of metoprolol.  If symptoms worsen, he should alert [place] so that we can discuss referral to EP for rhythm control strategies.  Coronary artery disease: No angina reported.  Catheterization prior to AVR in 2017 showed mild-moderate, nonobstructive CAD.  Continue medical therapy to prevent progression of disease.  Hypertension: Blood pressure well controlled today.  No medication changes at this time.  Hyperlipidemia associated with type 2 diabetes mellitus: LDL very well controlled on last check in June.  Continue atorvastatin 40 mg daily.  Ongoing management of diabetes mellitus per new PCP with whom [name] [name] will be establishing next week.  Follow-up: Return to clinic in 1 year with Gerrie Nordmann, NP.  Nelva Bush, MD 09/27/2022 7:38 AM

## 2022-09-27 ENCOUNTER — Encounter: Payer: Self-pay | Admitting: Internal Medicine

## 2022-09-27 DIAGNOSIS — E1169 Type 2 diabetes mellitus with other specified complication: Secondary | ICD-10-CM | POA: Insufficient documentation

## 2022-09-27 DIAGNOSIS — I10 Essential (primary) hypertension: Secondary | ICD-10-CM | POA: Insufficient documentation

## 2022-09-27 DIAGNOSIS — I4891 Unspecified atrial fibrillation: Secondary | ICD-10-CM | POA: Insufficient documentation

## 2022-09-27 DIAGNOSIS — I2510 Atherosclerotic heart disease of native coronary artery without angina pectoris: Secondary | ICD-10-CM | POA: Insufficient documentation

## 2022-10-19 ENCOUNTER — Encounter: Payer: Self-pay | Admitting: Cardiology

## 2022-10-24 ENCOUNTER — Ambulatory Visit: Payer: 59 | Attending: Internal Medicine | Admitting: *Deleted

## 2022-10-24 DIAGNOSIS — Z952 Presence of prosthetic heart valve: Secondary | ICD-10-CM

## 2022-10-24 LAB — POCT INR: INR: 4.6 — AB (ref 2.0–3.0)

## 2022-10-24 NOTE — Patient Instructions (Signed)
HOLD WARFARIN TONIGHT THEN RESUME 1.5 TABLETS DAILY.  INR Monthly 2nd or 3rd Wednesday of every month.

## 2022-11-14 ENCOUNTER — Ambulatory Visit: Payer: 59 | Attending: Internal Medicine | Admitting: *Deleted

## 2022-11-14 DIAGNOSIS — Z5181 Encounter for therapeutic drug level monitoring: Secondary | ICD-10-CM

## 2022-11-14 DIAGNOSIS — Z952 Presence of prosthetic heart valve: Secondary | ICD-10-CM

## 2022-11-14 LAB — POCT INR: INR: 2.9 (ref 2.0–3.0)

## 2022-11-14 NOTE — Patient Instructions (Signed)
Continue warfarin 1 1/2 tablets daily.  Recheck INR Monthly 2nd or 3rd Wednesday of every month.   

## 2022-12-13 ENCOUNTER — Ambulatory Visit: Payer: 59 | Attending: Internal Medicine | Admitting: *Deleted

## 2022-12-13 DIAGNOSIS — Z5181 Encounter for therapeutic drug level monitoring: Secondary | ICD-10-CM

## 2022-12-13 DIAGNOSIS — Z952 Presence of prosthetic heart valve: Secondary | ICD-10-CM | POA: Diagnosis not present

## 2022-12-13 LAB — POCT INR: INR: 3.3 — AB (ref 2.0–3.0)

## 2022-12-13 NOTE — Patient Instructions (Signed)
Continue warfarin 1 1/2 tablets daily.  Recheck INR Monthly 2nd or 3rd Wednesday of every month.

## 2023-01-17 ENCOUNTER — Ambulatory Visit: Payer: 59 | Attending: Internal Medicine | Admitting: *Deleted

## 2023-01-17 DIAGNOSIS — Z952 Presence of prosthetic heart valve: Secondary | ICD-10-CM

## 2023-01-17 DIAGNOSIS — Z5181 Encounter for therapeutic drug level monitoring: Secondary | ICD-10-CM

## 2023-01-17 LAB — POCT INR: INR: 1.6 — AB (ref 2.0–3.0)

## 2023-01-17 NOTE — Patient Instructions (Signed)
Take warfarin 2 1/2 tablets tonight and tomorrow night then resume 1 1/2 tablets daily.  Recheck INR Monthly 2nd or 3rd Wednesday of every month.   Recheck in 3 wks

## 2023-02-07 ENCOUNTER — Ambulatory Visit: Payer: 59 | Attending: Internal Medicine | Admitting: *Deleted

## 2023-02-07 DIAGNOSIS — Z5181 Encounter for therapeutic drug level monitoring: Secondary | ICD-10-CM | POA: Diagnosis not present

## 2023-02-07 DIAGNOSIS — Z952 Presence of prosthetic heart valve: Secondary | ICD-10-CM | POA: Diagnosis not present

## 2023-02-07 LAB — POCT INR: INR: 4.5 — AB (ref 2.0–3.0)

## 2023-02-07 NOTE — Patient Instructions (Signed)
Hold warfarin tonight then resume 1 1/2 tablets daily.  Recheck INR Monthly 2nd or 3rd Wednesday of every month.   Recheck in 4 wks

## 2023-03-07 ENCOUNTER — Ambulatory Visit: Payer: BC Managed Care – PPO | Attending: Internal Medicine

## 2023-03-07 DIAGNOSIS — Z952 Presence of prosthetic heart valve: Secondary | ICD-10-CM

## 2023-03-07 DIAGNOSIS — Z7901 Long term (current) use of anticoagulants: Secondary | ICD-10-CM

## 2023-03-07 LAB — POCT INR: INR: 3.5 — AB (ref 2.0–3.0)

## 2023-03-07 NOTE — Patient Instructions (Signed)
Description   Continue on same dosage of Warfarin 1 1/2 tablets daily.  Recheck INR Monthly 2nd or 3rd Wednesday of every month.   Recheck in 4 wks

## 2023-04-04 ENCOUNTER — Ambulatory Visit: Payer: BC Managed Care – PPO | Attending: Internal Medicine | Admitting: *Deleted

## 2023-04-04 DIAGNOSIS — Z952 Presence of prosthetic heart valve: Secondary | ICD-10-CM

## 2023-04-04 LAB — POCT INR: INR: 2.7 (ref 2.0–3.0)

## 2023-04-04 NOTE — Patient Instructions (Signed)
Continue on same dosage of Warfarin 1 1/2 tablets daily.  Recheck INR Monthly 2nd or 3rd Wednesday of every month.   Recheck in 5 wks

## 2023-05-09 ENCOUNTER — Ambulatory Visit: Payer: BC Managed Care – PPO | Attending: Internal Medicine | Admitting: *Deleted

## 2023-05-09 DIAGNOSIS — Z5181 Encounter for therapeutic drug level monitoring: Secondary | ICD-10-CM | POA: Diagnosis not present

## 2023-05-09 DIAGNOSIS — Z952 Presence of prosthetic heart valve: Secondary | ICD-10-CM

## 2023-05-09 LAB — POCT INR: INR: 2.9 (ref 2.0–3.0)

## 2023-05-09 NOTE — Patient Instructions (Signed)
Continue on same dosage of Warfarin 1 1/2 tablets daily.  Recheck INR Monthly 2nd or 3rd Wednesday of every month.   Recheck in 6 wks

## 2023-06-11 ENCOUNTER — Other Ambulatory Visit: Payer: Self-pay | Admitting: *Deleted

## 2023-06-11 MED ORDER — WARFARIN SODIUM 5 MG PO TABS: 7.5000 mg | ORAL_TABLET | Freq: Once | ORAL | 6 refills | Status: DC

## 2023-06-11 NOTE — Progress Notes (Signed)
Spoke with patient to confirm pharmacy. Reviewed and he was appreciative for the assistance.

## 2023-06-24 ENCOUNTER — Ambulatory Visit: Payer: BC Managed Care – PPO | Attending: Internal Medicine | Admitting: *Deleted

## 2023-06-24 DIAGNOSIS — Z5181 Encounter for therapeutic drug level monitoring: Secondary | ICD-10-CM | POA: Diagnosis not present

## 2023-06-24 DIAGNOSIS — Z952 Presence of prosthetic heart valve: Secondary | ICD-10-CM | POA: Diagnosis not present

## 2023-06-24 LAB — POCT INR: INR: 5.2 — AB (ref 2.0–3.0)

## 2023-06-24 NOTE — Patient Instructions (Signed)
Hold warfarin x 2 days then resume 1 1/2 tablets daily. Recheck in 2 wks

## 2023-07-08 ENCOUNTER — Ambulatory Visit: Payer: BC Managed Care – PPO | Attending: Internal Medicine | Admitting: *Deleted

## 2023-07-08 DIAGNOSIS — Z5181 Encounter for therapeutic drug level monitoring: Secondary | ICD-10-CM | POA: Diagnosis not present

## 2023-07-08 DIAGNOSIS — Z952 Presence of prosthetic heart valve: Secondary | ICD-10-CM | POA: Diagnosis not present

## 2023-07-08 LAB — POCT INR: INR: 3.7 — AB (ref 2.0–3.0)

## 2023-07-08 NOTE — Patient Instructions (Signed)
Take coumadin 1/2 tablet tonight then resume 1 1/2 tablets daily.  Recheck in 4 wks

## 2023-08-13 ENCOUNTER — Ambulatory Visit: Payer: BC Managed Care – PPO | Attending: Internal Medicine | Admitting: *Deleted

## 2023-08-13 DIAGNOSIS — Z952 Presence of prosthetic heart valve: Secondary | ICD-10-CM | POA: Diagnosis not present

## 2023-08-13 DIAGNOSIS — Z5181 Encounter for therapeutic drug level monitoring: Secondary | ICD-10-CM

## 2023-08-13 LAB — POCT INR: INR: 4 — AB (ref 2.0–3.0)

## 2023-08-13 NOTE — Patient Instructions (Signed)
Hold warfarin tonight then resume 1 1/2 tablets daily.  Get back on salads Recheck in 4 wks

## 2023-09-11 ENCOUNTER — Ambulatory Visit: Payer: BC Managed Care – PPO | Attending: Internal Medicine | Admitting: *Deleted

## 2023-09-11 ENCOUNTER — Other Ambulatory Visit: Payer: Self-pay

## 2023-09-11 DIAGNOSIS — Z5181 Encounter for therapeutic drug level monitoring: Secondary | ICD-10-CM

## 2023-09-11 DIAGNOSIS — Z952 Presence of prosthetic heart valve: Secondary | ICD-10-CM

## 2023-09-11 LAB — POCT INR: INR: 4.6 — AB (ref 2.0–3.0)

## 2023-09-11 MED ORDER — INDOMETHACIN 50 MG PO CAPS: 50.0000 mg | ORAL_CAPSULE | Freq: Every day | ORAL | 1 refills | Status: AC | PRN

## 2023-09-11 NOTE — Patient Instructions (Signed)
Hold warfarin tonight, take 1 tablet tomorrow night then resume 1 1/2 tablets daily.  Get back on salads Recheck in 4 wks per pt request

## 2023-10-09 ENCOUNTER — Ambulatory Visit: Payer: BC Managed Care – PPO | Attending: Internal Medicine | Admitting: *Deleted

## 2023-10-09 DIAGNOSIS — Z5181 Encounter for therapeutic drug level monitoring: Secondary | ICD-10-CM

## 2023-10-09 DIAGNOSIS — Z952 Presence of prosthetic heart valve: Secondary | ICD-10-CM

## 2023-10-09 LAB — POCT INR: INR: 4.1 — AB (ref 2.0–3.0)

## 2023-10-09 NOTE — Patient Instructions (Signed)
Hold warfarin tonight then decrease dose to 1 1/2 tablets daily except 1 tablet on Tuesdays and Fridays Get back on salads Recheck in 4 wks per pt request

## 2023-10-30 ENCOUNTER — Ambulatory Visit: Payer: BC Managed Care – PPO

## 2023-10-30 ENCOUNTER — Encounter: Payer: Self-pay | Admitting: Cardiology

## 2023-10-30 ENCOUNTER — Ambulatory Visit: Payer: BC Managed Care – PPO | Attending: Cardiology | Admitting: Cardiology

## 2023-10-30 VITALS — BP 104/74 | HR 105 | Ht 66.0 in | Wt 204.2 lb

## 2023-10-30 DIAGNOSIS — I2510 Atherosclerotic heart disease of native coronary artery without angina pectoris: Secondary | ICD-10-CM

## 2023-10-30 DIAGNOSIS — Q2381 Bicuspid aortic valve: Secondary | ICD-10-CM

## 2023-10-30 DIAGNOSIS — E785 Hyperlipidemia, unspecified: Secondary | ICD-10-CM

## 2023-10-30 DIAGNOSIS — Z952 Presence of prosthetic heart valve: Secondary | ICD-10-CM | POA: Diagnosis not present

## 2023-10-30 DIAGNOSIS — E1169 Type 2 diabetes mellitus with other specified complication: Secondary | ICD-10-CM

## 2023-10-30 DIAGNOSIS — I4891 Unspecified atrial fibrillation: Secondary | ICD-10-CM | POA: Diagnosis not present

## 2023-10-30 DIAGNOSIS — I4892 Unspecified atrial flutter: Secondary | ICD-10-CM

## 2023-10-30 DIAGNOSIS — I10 Essential (primary) hypertension: Secondary | ICD-10-CM

## 2023-10-30 MED ORDER — METOPROLOL TARTRATE 25 MG PO TABS: ORAL_TABLET | ORAL | 3 refills | Status: DC

## 2023-10-30 MED ORDER — LISINOPRIL 5 MG PO TABS: 5.0000 mg | ORAL_TABLET | Freq: Every day | ORAL | 3 refills | Status: DC

## 2023-10-30 NOTE — Patient Instructions (Signed)
Medication Instructions:  Your physician has recommended you make the following change in your medication:  lisinopril (ZESTRIL) 5 MG tablet - Take 1 tablet (5 mg total) by mouth daily  metoprolol tartrate (LOPRESSOR) 25 MG tablet - Take 1.5 tablets (37.5 mg) by mouth in the AM and 2 tablets (50 mg) by mouth in the evening  *If you need a refill on your cardiac medications before your next appointment, please call your pharmacy*   Lab Work: - None ordered  Testing/Procedures: Your physician has requested that you have an echocardiogram. Echocardiography is a painless test that uses sound waves to create images of your heart. It provides your doctor with information about the size and shape of your heart and how well your heart's chambers and valves are working. This procedure takes approximately one hour. There are no restrictions for this procedure. Please do NOT wear cologne, perfume, aftershave, or lotions (deodorant is allowed). Please arrive 15 minutes prior to your appointment time.  Please note: We ask at that you not bring children with you during ultrasound (echo/ vascular) testing. Due to room size and safety concerns, children are not allowed in the ultrasound rooms during exams. Our front office staff cannot provide observation of children in our lobby area while testing is being conducted. An adult accompanying a patient to their appointment will only be allowed in the ultrasound room at the discretion of the ultrasound technician under special circumstances. We apologize for any inconvenience.   Heart Monitor:  Your physician has requested you wear a ZIO XT patch heart monitor for 14 days.  Your monitor will be mailed to your home address within 3-5 business days. This is sent via [employer] from [employer]. However, if you have not received your monitor after 5 business days please send [place] a MyChart message or call the office at [phone], so we may follow up on this  for you.   This monitor is a medical device (single patch monitor) that records the heart's electrical activity. Doctors most often use these monitors to diagnose arrhythmias. Arrhythmias are problems with the speed or rhythm of the heartbeat.   [employer] supplies 1 patch per enrollment. Additional stickers are not available.  YouTube search Zio Patch Heart Monitor 3:46 for step-by-step video instructions Please DO NOT apply the patch if you will be having a Nuclear Stress Test, Echocardiogram, Cardiac CT, Cardiac MRI, Chest X-ray during the period you would be wearing the monitor. The patch cannot be worn during these tests.  You cannot remove and re-apply the ZIO patch monitor.   Applying the Monitor: Once you receive your monitor, this will include a small razor, abrader, and 4 alcohol pads. Shave hair from upper left chest Rub abrader disc in 40 strokes over the left upper chest as indicated in your monitor instructions Clean area with 4 enclosed alcohol pads (there may be a mild & brief stinging sensation over the newly abraded area, but this is normal). Let dry Apply patch as indicated in monitor instructions. Patch will be placed under collarbone on the left side of the chest with arrow pointing upward. Rub adhesive wings for 2 minutes. Remove white label marked "1". Remove the white label marked "2". Rub patch adhesive wings for an 2 minutes.  While looking in a mirror, press and release button in the center of the patch. You may hear a "click". A small green light will flash 4-6 times and then stop. This will be your indicator that the  monitor has been turned on.  Wearing the Monitor: Avoid showering during the first 24 hours of wearing the monitor.  After 24 hours you may shower with the patch on. Take brief showers with your back facing the shower head.  Avoid excessive sweating to help maximize wear time. Do not submerge the device, no hot tubs, and no swimming pools. Keep any  lotions or oils away from the patch. Press the button if you feel a symptom. You will hear a small click. Record date, time, and symptoms in the Patient Logbook or App.  Monitor Issues: Call [employer] Customer Care at [phone] if you have questions regarding your Zio Patch Monitor. Call them immediately if you see an orange/ amber colored light blinking on your monitor. If your monitor falls off and you cannot get this reapplied or if you need suggestions for securing your monitor call [employer] at [phone].   Returning the Monitor: Once you have completed wearing your monitor, follow instructions on the last 2 pages of the Patient Logbook. Stick monitor patch on to the last page of the Patient Logbook.  Place Patient Logbook with monitor in the return box provided. Use locking tab on box and tape box closed securely. The return box has pre-paid postage on it.  Place the return box in the regular [place] Mail box as soon as possible It will take anywhere from 1-2 weeks for your provider to receive and review your results once you mail this back. If for some reason you have misplaced your return box then call our office and we can provide another box and/or mail it off for you.   Billing  and Patient Assistance Program Information: We have supplied [employer] with any of your insurance information on file for billing purposes. [employer] offers a sliding scale Patient Assistance Program for patients that do not have insurance, or whose insurance does not completely cover the cost of the ZIO monitor. You must apply for the Patient Assistance Program to qualify for this discounted rate. To apply, please call [employer] at [phone], select option 1, ask to apply for the Patient Assistance Program. [employer] will ask your household income, and how many people are in your household. They will quote your out-of-pocket cost based on that information. [employer] will also be able to set up for a  98-month, interest-free payment plan if needed.     Follow-Up: At [facility], you and your health needs are our priority.  As part of our continuing mission to provide you with exceptional heart care, we have created designated Provider Care Teams.  These Care Teams include your primary Cardiologist (physician) and Advanced Practice Providers (APPs -  Physician Assistants and Nurse Practitioners) who all work together to provide you with the care you need, when you need it.  Your next appointment:   3 month(s)  Provider:   Charlsie Quest, NP    Other Instructions - None

## 2023-10-30 NOTE — Progress Notes (Signed)
Cardiology Office Note:  .   Date:  10/30/2023  ID:  [name] [name], DOB 1971-03-20, MRN [id number] PCP: Tacy Learn, FNP  [facility] Providers Cardiologist:  None    History of Present Illness: .   [name] [name] is a 52 y.o. male with a past medical history of aortic stenosis status post mechanical aortic valve repair (07/2016), nonobstructive CAD, paroxysmal atrial fibrillation/flutter, hypertension, hyperlipidemia, type 2 diabetes, hypothyroidism, and gout, who is here today for follow-up.   Patient was originally found to have a cardiac murmur in 2010.  At that time an echocardiogram with demonstrated no significant valvular abnormalities according to the patient.  He was asymptomatic and no further records were performed for recent onset of symptoms with dyspnea on exertion and with 3 episodes of exertional presyncope or dizziness.  Patient denies any rest symptoms of chest pain, orthopnea, PND or ankle edema.  There is no family history of aortic valve disease.  On repeat echocardiogram he was found to have a bicuspid aortic valve.  Right and left heart catheter showed mildly dilated ascending aorta well-preserved LV systolic function.  Aortic valve area was 0.8 with a heavily calcified aortic valve.  LVEDP was measured at 20 mmHg.  Cardiac output was 5.5 L/min.  There were no significant coronary artery lesions.  He underwent a CT scan of the chest which demonstrated ascending aorta diameter 4.2 cm which would not require replacement but he had a large PSA.  He was examined by his dentist documentation that his olanzapine is adequate for valve replacement surgery.  He was admitted to [facility] on 8/29 2017 and underwent aortic valve replacement with mechanical valve.  He tolerated the procedure well with no immediate postoperative complications and was transferred to ICU.  He remained hemodynamically stable however he did require pressor support at the time.  He was  started on Coumadin for anticoagulation for his mechanical valve.  He was continued on diuretic regimen due to fluid overload.  He progressed appropriately and chest tubes were removed.  On postop day 3 he was transferred to telemetry unit.  He continued to progress without any further issues and was considered stable for discharge from the hospital in 08/19/2016.  He is continue to have surveillance echocardiograms yearly since his aortic valve replacement and is continued on Coumadin therapy without incident.   He was last seen in clinic 09/26/2022 by Dr. Okey Dupre.  At that time he was doing well without any complaints.  He continued to note occasional palpitations.  The episodes been present ever since his aortic valve replacement in 2017.  Denied any other associated symptoms.  Hide recently had an echocardiogram completed in [place] [facility] on [facility]2022 which revealed an LVEF of 55-60% with normal wall motion, mild to moderate RV dilatation with normal function, mild biatrial enlargement, mechanical aortic valve prosthesis present with mild regurgitation and mild mitral annular calcifications with mild MR and mild TR.  Borderline dilated aortic root measuring 4.2 cm.  Continue to stressed the importance of SBE prophylaxis with dental cleanings.  Also discussed if he continues with sporadic palpitations which seem positional and are brief and self-limited if continued or worsening of symptoms could refer him to EP for rhythm control strategies.  No other medication changes were made at that time.  He returns to clinic today stating that overall he has been doing fairly well.  He has noted increased amount of palpitations that are primarily interfere with him nightly.  He states that he occasionally feels he is not getting enough and he is not unable to take a deep breath.  He denies any chest pain, chest tightness, peripheral edema, lightheadedness or dizziness.  He denies any bleeding on his  warfarin with no blood noted in his urine or stool.  Continues to follow with Coumadin clinic.  He denies any recent hospitalizations or visits to the emergency department.  ROS: 10 point review of systems has been reviewed and considered negative except what is been listed in HPI  Studies Reviewed: Marland Kitchen   EKG Interpretation Date/Time:  Wednesday October 30 2023 09:38:28 EST Ventricular Rate:  105 PR Interval:    QRS Duration:  86 QT Interval:  336 QTC Calculation: 444 R Axis:   32  Text Interpretation: Atrial fibrillation with rapid ventricular response When compared with ECG of 15-Aug-2016 06:54, Atrial fibrillation has replaced Sinus rhythm Confirmed by Charlsie Quest ([id number]) on 10/30/2023 10:01:52 AM    Cath/PCI: R/LHC (07/04/2016): Normal LMCA.  Mild luminal irregularities of the mid LAD.  Codominant LCx/RCA.  20-30% proximal and 30-40% distal RCA stenoses.  Moderate-severe aortic regurgitation.  Severe aortic stenosis.   CV Surgery: Aortic valve replacement (08/16/2016, Dr. Maren Beach): 25 mm Carbo[employer]ics supra-annular mechanical valve    TTE (06/20/2022, [facility]  Normal LV size with mild-moderate LVH.  LVEF 5-60% with normal wall motion.  Mild-moderate RV dilation with normal function.  Mild biatrial enlargement.  Mechanical aortic valve prosthesis present with mild regurgitation and peak velocity of 2.8 m/s.  AVA 1.95 cm.  Mild mitral annular calcification with mild MR.  Mild TR.  Borderline dilated aortic root measuring 4.2 cm. Risk Assessment/Calculations:    CHA2DS2-VASc Score = 2   This indicates a 2.2% annual risk of stroke. The patient's score is based upon: CHF History: 0 HTN History: 1 Diabetes History: 1 Stroke History: 0 Vascular Disease History: 0 Age Score: 0 Gender Score: 0            Physical Exam:   VS:  BP 104/74 (BP Location: Left Arm, Patient Position: Sitting, Cuff Size: Normal)   Pulse (!) 105   Ht 5\' 6"  (1.676 m)   Wt 204  lb 3.2 oz (92.6 kg)   SpO2 96%   BMI 32.96 kg/m    Wt Readings from Last 3 Encounters:  10/30/23 204 lb 3.2 oz (92.6 kg)  09/26/22 199 lb 6.4 oz (90.4 kg)  10/24/16 235 lb (106.6 kg)    GEN: Well nourished, well developed in no acute distress NECK: No JVD; No carotid bruits CARDIAC: IR IR, II/VI systolic murmur, audible click for mechanical valve, without rubs or gallops RESPIRATORY:  Clear to auscultation without rales, wheezing or rhonchi  ABDOMEN: Soft, non-tender, non-distended EXTREMITIES:  No edema; No deformity   ASSESSMENT AND PLAN: .   Bicuspid aortic valve with severe aortic stenosis status post mechanical valve replacement in 2017.  He is continued on aspirin and warfarin.  He continues to follow with Coumadin clinic in [place].  He is encouraged to continue with SBE prophylaxis with dental cleaning exams.  He has been advised to notify the office when he needs refill for his amoxicillin 2000 mg to be taken an hour before his dental procedure.  Scheduled for follow-up echocardiogram.  Paroxysmal atrial fibrillation atrial flutter where he is noted to be in atrial fibrillation on EKG today.  He has continued to have an increased amount of palpitations.  His metoprolol has been increased to 37.5  mg a.m. and 50 mg in the p.m. he has been scheduled for a ZIO XT monitor for 2 weeks to reassess his burden of atrial fibrillation.  If increasing of his beta-blocker therapy does not help regulate his rhythm can consider referral to EP at that time or possible cardioversion procedure.  He is maintained on warfarin for CHA2DS2-VASc score of at least 2 for stroke prophylaxis.  Coronary artery disease with prior catheterization in 2017 showing mild to moderate nonobstructive CAD.  Continue medical therapy to prevent progression of disease with aspirin 81 mg daily and atorvastatin 40 mg daily.  EKG today reveals atrial fibrillation with no ischemic changes.  He continues to deny anginal or anginal  equivalents.  No further testing at this time.  Hypertension with blood pressure well-controlled today 104/74.  With escalation of beta-blocker therapy we have decreased his lisinopril to 5 mg daily to prevent hypotension.  He is encouraged to continue to monitor his blood pressure 1 to 2 hours postmedication administration at home as well.  Hyperlipidemia associated with type 2 diabetes with recent labs we are requesting from his PCP.  He is currently continued on atorvastatin 40 mg daily.       Dispo: Patient to return to clinic to see MD/APP in 3 months or sooner if needed for reevaluation of symptoms  Signed, Linken Mcglothen, NP

## 2023-11-01 LAB — EXTERNAL GENERIC LAB PROCEDURE: COLOGUARD: NEGATIVE

## 2023-11-01 LAB — COLOGUARD: COLOGUARD: NEGATIVE

## 2023-11-04 DIAGNOSIS — I4892 Unspecified atrial flutter: Secondary | ICD-10-CM | POA: Diagnosis not present

## 2023-11-04 DIAGNOSIS — I4891 Unspecified atrial fibrillation: Secondary | ICD-10-CM

## 2023-11-06 ENCOUNTER — Ambulatory Visit: Payer: BC Managed Care – PPO | Attending: Internal Medicine

## 2023-11-06 DIAGNOSIS — Z7901 Long term (current) use of anticoagulants: Secondary | ICD-10-CM

## 2023-11-06 DIAGNOSIS — Z952 Presence of prosthetic heart valve: Secondary | ICD-10-CM

## 2023-11-06 LAB — POCT INR: INR: 2.1 (ref 2.0–3.0)

## 2023-11-06 NOTE — Patient Instructions (Signed)
Description   Take 2 tablets today, then resume same dosage of Warfarin 1.5 tablets daily except 1 tablet on Tuesdays and Fridays Get back on salads Recheck in 4 wks per pt request

## 2023-11-20 ENCOUNTER — Ambulatory Visit: Payer: BC Managed Care – PPO | Attending: Cardiology

## 2023-11-20 DIAGNOSIS — I4891 Unspecified atrial fibrillation: Secondary | ICD-10-CM | POA: Diagnosis not present

## 2023-11-20 DIAGNOSIS — I4892 Unspecified atrial flutter: Secondary | ICD-10-CM

## 2023-11-21 LAB — ECHOCARDIOGRAM COMPLETE
AR max vel: 1.1 cm2
AV Area VTI: 1.03 cm2
AV Area mean vel: 1.04 cm2
AV Mean grad: 11 mm[Hg]
AV Peak grad: 17.1 mm[Hg]
Ao pk vel: 2.07 m/s
Calc EF: 56.4 %
MV VTI: 1.97 cm2
S' Lateral: 3.2 cm
Single Plane A2C EF: 52.7 %
Single Plane A4C EF: 53.7 %

## 2023-11-22 NOTE — Progress Notes (Signed)
Heart squeeze noted at 45-50% which is mildly decreased function, trivial mitral valve regurgitation which is leakage in the valve, aortic valve replacement is seated in the appropriate position.

## 2023-12-03 NOTE — Progress Notes (Signed)
Refer to EP for A-fib burden of 35% for work up with potential antiarrhythmics or possible ablation procedure since he is severely symptomatic. (Wife already notified.)

## 2023-12-04 ENCOUNTER — Ambulatory Visit: Payer: BC Managed Care – PPO | Attending: Internal Medicine | Admitting: *Deleted

## 2023-12-04 DIAGNOSIS — Z5181 Encounter for therapeutic drug level monitoring: Secondary | ICD-10-CM

## 2023-12-04 DIAGNOSIS — Z952 Presence of prosthetic heart valve: Secondary | ICD-10-CM | POA: Diagnosis not present

## 2023-12-04 LAB — POCT INR: INR: 4.4 — AB (ref 2.0–3.0)

## 2023-12-04 NOTE — Patient Instructions (Signed)
Hold warfarin today, then resume same dosage of Warfarin 1.5 tablets daily except 1 tablet on Tuesdays and Fridays Get back on salads Recheck in 4 wks per pt request

## 2023-12-05 ENCOUNTER — Other Ambulatory Visit: Payer: Self-pay

## 2023-12-05 DIAGNOSIS — I4891 Unspecified atrial fibrillation: Secondary | ICD-10-CM

## 2023-12-25 ENCOUNTER — Other Ambulatory Visit: Payer: Self-pay

## 2023-12-25 MED ORDER — WARFARIN SODIUM 5 MG PO TABS: ORAL_TABLET | ORAL | 1 refills | Status: DC

## 2023-12-25 MED ORDER — LISINOPRIL 5 MG PO TABS: 5.0000 mg | ORAL_TABLET | Freq: Every day | ORAL | 3 refills | Status: AC

## 2023-12-26 ENCOUNTER — Other Ambulatory Visit: Payer: Self-pay | Admitting: *Deleted

## 2023-12-26 DIAGNOSIS — I4891 Unspecified atrial fibrillation: Secondary | ICD-10-CM

## 2023-12-26 MED ORDER — WARFARIN SODIUM 5 MG PO TABS: ORAL_TABLET | ORAL | 1 refills | Status: DC

## 2023-12-26 NOTE — Telephone Encounter (Signed)
 Warfarin 5mg  refill for [facility] S/p AVR Last INR 12/04/23 Last OV 10/30/23

## 2023-12-30 NOTE — Progress Notes (Signed)
 Electrophysiology Office Note:   Date:  12/31/2023  ID:  [name] [name], DOB 1971/11/02, MRN [id number]  Primary Cardiologist: None Primary Heart Failure: None Electrophysiologist: Fonda Kitty, MD      History of Present Illness:   [name] [name] is a 53 y.o. male with h/o cuspid aortic valve complicated by aortic stenosis status post mechanical aortic valve repair (07/2016), nonobstructive CAD, paroxysmal atrial fibrillation/flutter, hypertension, hyperlipidemia, type 2 diabetes, hypothyroidism, and gout who is being seen today for evaluation of his atrial fibrillation at the request of Tylene Lunch, NP.  Patient has had palpitations ever since his aortic valve surgery in 2017.  He wore a ZIO monitor in 2021 which showed less than 1% burden of AF.  He endorsed increased frequency of palpitations of late at his last cardiology appointment.  EKG was done which showed atrial fibrillation.  A monitor was then repeated which showed a 35% burden of atrial fibrillation.  His symptoms are palpitations and shortness of breath. He can usually push through this and complete his usual activities. He is otherwise doing well with no new or acute complaints today.   Review of systems complete and found to be negative unless listed in HPI.   EP Information / Studies Reviewed:    EKG is ordered today. Personal review as below.  EKG Interpretation Date/Time:  Tuesday December 31 2023 10:05:57 EST Ventricular Rate:  57 PR Interval:  152 QRS Duration:  82 QT Interval:  408 QTC Calculation: 397 R Axis:   44  Text Interpretation: Sinus bradycardia When compared with ECG of 30-Oct-2023 09:38, Sinus rhythm has replaced Atrial fibrillation Vent. rate has decreased BY  48 BPM Confirmed by Kitty Fonda [phone]) on 12/31/2023 10:08:17 AM  EKG 10/30/23:    Zio 11/29/23:   The patient was monitored for 12 days, 15 hours.   The predominant rhythm was sinus with an average rate of 62 bpm in sinus (range  43-101 bpm in sinus).   There were occasional PAC's (1.5% burden) and rare PVC's.   8 supraventricular runs were observed, lasting up to 12 beats with a maximum rate of 184 bpm.   Paroxysmal atrial fibrillation occurred, with an average ventricular rate of 106 bpm (range 53-188 bpm).  Longest episode lasted 1 day, 16 hours.  Atrial fibrillation burden was 35%.   No prolonged pause was observed.   Patient triggered events correspond to sinus rhythm, atrial fibrillation, PSVT, PAC's, and PVC's.   Sinus rhythm with paroxysmal atrial fibrillation, PSVT, and occasional PAC's, as detailed above.  Echo 11/20/23: Mildly decreased LV systolic function.  LVEF 45 to 50%. Normal RV size and function. Normal functioning mechanical aortic valve.  Mean gradient 11 mmHg. Left atrium is normal in size.  Right atrium is normal in size.  Risk Assessment/Calculations:    CHA2DS2-VASc Score = 2   This indicates a 2.2% annual risk of stroke. The patient's score is based upon: CHF History: 0 HTN History: 1 Diabetes History: 1 Stroke History: 0 Vascular Disease History: 0 Age Score: 0 Gender Score: 0             Physical Exam:   VS:  BP 112/74   Pulse (!) 57   Ht 5' 7 (1.702 m)   Wt 212 lb 6.4 oz (96.3 kg)   SpO2 98%   BMI 33.27 kg/m    Wt Readings from Last 3 Encounters:  12/31/23 212 lb 6.4 oz (96.3 kg)  10/30/23 204 lb 3.2 oz (92.6 kg)  09/26/22 199 lb 6.4 oz (90.4 kg)     GEN: Well nourished, well developed in no acute distress NECK: No JVD CARDIAC:  RESPIRATORY:  Clear to auscultation without rales, wheezing or rhonchi  ABDOMEN: Soft, non-tender, non-distended EXTREMITIES:  No edema; No deformity   ASSESSMENT AND PLAN:   [name] [name] is a 53 y.o. male with h/o cuspid aortic valve complicated by aortic stenosis status post mechanical aortic valve repair (07/2016), nonobstructive CAD, paroxysmal atrial fibrillation/flutter, hypertension, hyperlipidemia, type 2 diabetes,  hypothyroidism, and gout who is being seen today for evaluation of his atrial fibrillation at the request of Tylene Lunch, NP.  # Symptomatic paroxysmal atrial fibrillation: Increasing burden, now 35% on recent Zio monitor. -Discussed treatment options today for AF including antiarrhythmic drug therapy with dofetilide and ablation. Discussed risks, recovery and likelihood of success with each treatment strategy. Risk, benefits, and alternatives to EP study and ablation for afib were discussed. These risks include but are not limited to stroke, bleeding, vascular damage, tamponade, perforation, damage to the esophagus, lungs, phrenic nerve and other structures, pulmonary vein stenosis, worsening renal function, coronary vasospasm and death.  Discussed potential need for repeat ablation procedures and antiarrhythmic drugs after an initial ablation. The patient understands these risk and wishes to proceed with scheduling ablation at this time.   Will also obtain CT PV protocol prior to the procedure to exclude LAA thrombus and further evaluate atrial anatomy. He is going to continue thinking about his options and discuss with family and friends.  If he chooses to cancel the ablation then I think he should pursue hospital admission for Tikosyn loading given the presence of symptomatic atrial fibrillation and an ejection fraction that is below normal.. -Continue metoprolol  for now.  # Secondary hypercoagulable state due to atrial fibrillation:  -CHA2DS2-VASc score of 3. -Continue warfarin.  He is on this for mechanical aortic valve prosthesis as well.  # Bicuspid aortic valve complicated by severe aortic stenosis status post mechanical AVR in 2017: -Appears well compensated on exam today.  Continue follow-up with general cardiology. -Continue warfarin.  #Hypertension -At goal today.  Recommend checking blood pressures 1-2 times per week at home and recording the values.  Recommend bringing these  recordings to the primary care physician.  Signed, Fonda Kitty, MD

## 2023-12-31 ENCOUNTER — Other Ambulatory Visit: Payer: Self-pay

## 2023-12-31 ENCOUNTER — Encounter: Payer: Self-pay | Admitting: Cardiology

## 2023-12-31 ENCOUNTER — Telehealth: Payer: Self-pay | Admitting: *Deleted

## 2023-12-31 ENCOUNTER — Ambulatory Visit: Payer: BC Managed Care – PPO | Attending: Cardiology | Admitting: Cardiology

## 2023-12-31 VITALS — BP 112/74 | HR 57 | Ht 67.0 in | Wt 212.4 lb

## 2023-12-31 DIAGNOSIS — I480 Paroxysmal atrial fibrillation: Secondary | ICD-10-CM | POA: Diagnosis not present

## 2023-12-31 DIAGNOSIS — I4891 Unspecified atrial fibrillation: Secondary | ICD-10-CM

## 2023-12-31 DIAGNOSIS — I4892 Unspecified atrial flutter: Secondary | ICD-10-CM

## 2023-12-31 DIAGNOSIS — D6869 Other thrombophilia: Secondary | ICD-10-CM

## 2023-12-31 DIAGNOSIS — Z952 Presence of prosthetic heart valve: Secondary | ICD-10-CM | POA: Diagnosis not present

## 2023-12-31 DIAGNOSIS — I10 Essential (primary) hypertension: Secondary | ICD-10-CM | POA: Diagnosis not present

## 2023-12-31 NOTE — Telephone Encounter (Signed)
 Pt has INR appt on 01/01/24.  Will schedule 3 weekly INR checks at that time.  Will start wk of 01/27/24.

## 2023-12-31 NOTE — Patient Instructions (Addendum)
 Medication Instructions:  Your physician recommends that you continue on your current medications as directed. Please refer to the Current Medication list given to you today.  *If you need a refill on your cardiac medications before your next appointment, please call your pharmacy*   Lab Work: BMET and CBC - go to [facility] anytime after February 3rd to have labs drawn.    Testing/Procedures:  Cardiac CT  Your physician has requested that you have cardiac CT. Cardiac computed tomography (CT) is a painless test that uses an x-ray machine to take clear, detailed pictures of your heart. For further information please visit [email or web address]  We will call you to schedule your CT scan. It will be done about three weeks prior to your ablation.  Ablation Your physician has recommended that you have an ablation. Catheter ablation is a medical procedure used to treat some cardiac arrhythmias (irregular heartbeats). During catheter ablation, a long, thin, flexible tube is put into a blood vessel in your groin (upper thigh), or neck. This tube is called an ablation catheter. It is then guided to your heart through the blood vessel. Radio frequency waves destroy small areas of heart tissue where abnormal heartbeats may cause an arrhythmia to start. You are scheduled for Atrial Fibrillation Ablation on Monday, March 3 with Dr. Sidra Kitty.Please arrive at the Main Entrance A at [facility]: [address] [place], [place] [address] at 5:30 AM    Follow-Up: At [facility], you and your health needs are our priority.  As part of our continuing mission to provide you with exceptional heart care, we have created designated Provider Care Teams.  These Care Teams include your primary Cardiologist (physician) and Advanced Practice Providers (APPs -  Physician Assistants and Nurse Practitioners) who all work together to provide you with the care you need, when you need it.  Your next  appointment:   We will call you to arrange your follow up appointments.

## 2023-12-31 NOTE — Telephone Encounter (Signed)
-----   Message from Nurse Carlyle C sent at 12/31/2023 10:53 AM EST ----- Patient is scheduled for an A Fib ablation on 02/17/24. He will need 3 therapeutic weekly INRs prior.   Thanks! Carly

## 2024-01-01 ENCOUNTER — Ambulatory Visit: Payer: BC Managed Care – PPO | Attending: Internal Medicine | Admitting: *Deleted

## 2024-01-01 DIAGNOSIS — Z5181 Encounter for therapeutic drug level monitoring: Secondary | ICD-10-CM

## 2024-01-01 DIAGNOSIS — Z952 Presence of prosthetic heart valve: Secondary | ICD-10-CM

## 2024-01-01 LAB — POCT INR: INR: 3.4 — AB (ref 2.0–3.0)

## 2024-01-01 NOTE — Patient Instructions (Signed)
 Continue warfarin 1.5 tablets daily except 1 tablet on Tuesdays and Fridays Continue  salads Recheck in 4 wks per pt request

## 2024-01-22 ENCOUNTER — Other Ambulatory Visit: Payer: Self-pay | Admitting: *Deleted

## 2024-01-22 DIAGNOSIS — I10 Essential (primary) hypertension: Secondary | ICD-10-CM

## 2024-01-22 DIAGNOSIS — I480 Paroxysmal atrial fibrillation: Secondary | ICD-10-CM

## 2024-01-23 LAB — CBC WITH DIFFERENTIAL/PLATELET
Basophils Absolute: 0.1 10*3/uL (ref 0.0–0.2)
Basos: 1 %
EOS (ABSOLUTE): 0.2 10*3/uL (ref 0.0–0.4)
Eos: 3 %
Hematocrit: 49.2 % (ref 37.5–51.0)
Hemoglobin: 16.5 g/dL (ref 13.0–17.7)
Immature Grans (Abs): 0 10*3/uL (ref 0.0–0.1)
Immature Granulocytes: 0 %
Lymphocytes Absolute: 2 10*3/uL (ref 0.7–3.1)
Lymphs: 27 %
MCH: 30.6 pg (ref 26.6–33.0)
MCHC: 33.5 g/dL (ref 31.5–35.7)
MCV: 91 fL (ref 79–97)
Monocytes Absolute: 0.6 10*3/uL (ref 0.1–0.9)
Monocytes: 8 %
Neutrophils Absolute: 4.6 10*3/uL (ref 1.4–7.0)
Neutrophils: 61 %
Platelets: 223 10*3/uL (ref 150–450)
RBC: 5.4 x10E6/uL (ref 4.14–5.80)
RDW: 13.6 % (ref 11.6–15.4)
WBC: 7.6 10*3/uL (ref 3.4–10.8)

## 2024-01-23 LAB — BASIC METABOLIC PANEL
BUN/Creatinine Ratio: 19 (ref 9–20)
BUN: 18 mg/dL (ref 6–24)
CO2: 22 mmol/L (ref 20–29)
Calcium: 10 mg/dL (ref 8.7–10.2)
Chloride: 105 mmol/L (ref 96–106)
Creatinine, Ser: 0.96 mg/dL (ref 0.76–1.27)
Glucose: 138 mg/dL — ABNORMAL HIGH (ref 70–99)
Potassium: 4.9 mmol/L (ref 3.5–5.2)
Sodium: 143 mmol/L (ref 134–144)
eGFR: 95 mL/min/{1.73_m2} (ref >59)

## 2024-01-29 ENCOUNTER — Ambulatory Visit
Admission: RE | Admit: 2024-01-29 | Discharge: 2024-01-29 | Disposition: A | Discharge status: Home / Self Care | Payer: BC Managed Care – PPO | Source: Ambulatory Visit | Attending: Cardiology | Admitting: Cardiology

## 2024-01-29 ENCOUNTER — Ambulatory Visit: Payer: BC Managed Care – PPO | Attending: Internal Medicine

## 2024-01-29 DIAGNOSIS — I480 Paroxysmal atrial fibrillation: Secondary | ICD-10-CM | POA: Insufficient documentation

## 2024-01-29 DIAGNOSIS — Z7901 Long term (current) use of anticoagulants: Secondary | ICD-10-CM | POA: Diagnosis not present

## 2024-01-29 DIAGNOSIS — Z952 Presence of prosthetic heart valve: Secondary | ICD-10-CM

## 2024-01-29 LAB — POCT INR: INR: 4.2 — AB (ref 2.0–3.0)

## 2024-01-29 MED ORDER — IOHEXOL 350 MG/ML SOLN
100.0000 mL | Freq: Once | INTRAVENOUS | Status: AC | PRN
  Administered 2024-01-29: 100 mL via INTRAVENOUS

## 2024-01-29 MED ORDER — SODIUM CHLORIDE 0.9 % IV BOLUS
150.0000 mL | Freq: Once | INTRAVENOUS | Status: AC
  Administered 2024-01-29: 150 mL via INTRAVENOUS

## 2024-01-29 NOTE — Patient Instructions (Signed)
Continue warfarin 1.5 tablets daily except 1 tablet on Tuesdays and Fridays Continue salads and have salad tonight.  INR in 1 week Recheck in 4 wks per pt request

## 2024-02-04 ENCOUNTER — Ambulatory Visit: Payer: BC Managed Care – PPO | Attending: Internal Medicine | Admitting: *Deleted

## 2024-02-04 DIAGNOSIS — Z5181 Encounter for therapeutic drug level monitoring: Secondary | ICD-10-CM | POA: Diagnosis not present

## 2024-02-04 DIAGNOSIS — Z952 Presence of prosthetic heart valve: Secondary | ICD-10-CM

## 2024-02-04 LAB — POCT INR: INR: 2.7 (ref 2.0–3.0)

## 2024-02-04 NOTE — Patient Instructions (Signed)
Continue warfarin 1.5 tablets daily except 1 tablet on Tuesdays and Fridays  INR in 1 week Pending ablation 02/17/24

## 2024-02-05 ENCOUNTER — Ambulatory Visit: Payer: BC Managed Care – PPO | Admitting: Cardiology

## 2024-02-12 ENCOUNTER — Ambulatory Visit: Payer: BC Managed Care – PPO | Attending: Internal Medicine | Admitting: *Deleted

## 2024-02-12 DIAGNOSIS — Z5181 Encounter for therapeutic drug level monitoring: Secondary | ICD-10-CM | POA: Diagnosis not present

## 2024-02-12 DIAGNOSIS — Z952 Presence of prosthetic heart valve: Secondary | ICD-10-CM | POA: Diagnosis not present

## 2024-02-12 LAB — POCT INR: INR: 2.3 (ref 2.0–3.0)

## 2024-02-12 NOTE — Patient Instructions (Signed)
 Take warfarin 2 tablets tonight then resume 1.5 tablets daily except 1 tablet on Tuesdays and Fridays  INR in 1 week after procedure Pending ablation 02/17/24

## 2024-02-14 NOTE — Pre-Procedure Instructions (Signed)
 Instructed patient on the following items: Arrival time 0530 Nothing to eat or drink after midnight No meds AM of procedure Responsible person to drive you home and stay with you for 24 hrs  Have you missed any doses of anti-coagulant Coumadin- takes once a day, hasn't missed any doses.

## 2024-02-16 NOTE — Anesthesia Preprocedure Evaluation (Addendum)
 Anesthesia Evaluation  Patient identified by MRN, date of birth, ID band Patient awake    Reviewed: Allergy & Precautions, NPO status , Patient's Chart, lab work & pertinent test results  History of Anesthesia Complications (+) Emergence Delirium and history of anesthetic complications  Airway Mallampati: II       Dental no notable dental hx. (+) Teeth Intact, Dental Advisory Given   Pulmonary shortness of breath and with exertion, former smoker   Pulmonary exam normal breath sounds clear to auscultation       Cardiovascular hypertension, Pt. on medications and Pt. on home beta blockers + CAD  Normal cardiovascular exam+ Valvular Problems/Murmurs AS  Rhythm:Regular    EKG 12/31/23 Sinus bradycardia, 57/min  Echo 12/424 1. Left ventricular ejection fraction, by estimation, is 45 to 50%. The  left ventricle has mildly decreased function. Left ventricular endocardial  border not optimally defined to evaluate regional wall motion. Left  ventricular diastolic function could  not be evaluated.   2. Right ventricular systolic function is normal. The right ventricular  size is normal. Tricuspid regurgitation signal is inadequate for assessing  PA pressure.   3. The mitral valve is grossly normal. Trivial mitral valve  regurgitation. No evidence of mitral stenosis.   4. The aortic valve has been repaired/replaced. Aortic valve  regurgitation is trivial. No aortic stenosis is present. There is a 25 mm  CarboMedics mechanical valve present in the aortic position. Procedure  Date: 08/16/2016. Aortic valve mean gradient  measures 11.0 mmHg.   5. Aortic dilatation noted. There is mild dilatation of the aortic root,  measuring 42 mm.   Hx/o AVR 07/2016 bicuspid AV w/ stenosis   Neuro/Psych  Headaches  negative psych ROS   GI/Hepatic Neg liver ROS,GERD  Medicated,,  Endo/Other  diabetesHypothyroidism  Hyperlipidemia Obesity Gout   Renal/GU negative Renal ROSHx/o renal calculi  negative genitourinary   Musculoskeletal  (+) Arthritis , Osteoarthritis,    Abdominal  (+) + obese  Peds  Hematology Coumadin therapy- last dose yesterday INR 2.8 PT 29.5   Anesthesia Other Findings   Reproductive/Obstetrics                             Anesthesia Physical Anesthesia Plan  ASA: 3  Anesthesia Plan: General   Post-op Pain Management: Minimal or no pain anticipated   Induction: Intravenous  PONV Risk Score and Plan: 2 and Treatment may vary due to age or medical condition, TIVA and Ondansetron  Airway Management Planned: Oral ETT  Additional Equipment: None  Intra-op Plan:   Post-operative Plan: Extubation in OR  Informed Consent: I have reviewed the patients History and Physical, chart, labs and discussed the procedure including the risks, benefits and alternatives for the proposed anesthesia with the patient or authorized representative who has indicated his/her understanding and acceptance.     Dental advisory given  Plan Discussed with: CRNA and Anesthesiologist  Anesthesia Plan Comments:         Anesthesia Quick Evaluation

## 2024-02-17 ENCOUNTER — Ambulatory Visit (HOSPITAL_COMMUNITY)
Admission: RE | Disposition: A | Discharge status: Home / Self Care | Payer: Self-pay | Source: Ambulatory Visit | Attending: Cardiology

## 2024-02-17 ENCOUNTER — Ambulatory Visit (HOSPITAL_COMMUNITY): Payer: Self-pay | Admitting: Anesthesiology

## 2024-02-17 ENCOUNTER — Other Ambulatory Visit: Payer: Self-pay

## 2024-02-17 ENCOUNTER — Ambulatory Visit (HOSPITAL_COMMUNITY)
Admission: RE | Admit: 2024-02-17 | Discharge: 2024-02-17 | Disposition: A | Discharge status: Home / Self Care | Payer: BC Managed Care – PPO | Source: Ambulatory Visit | Attending: Cardiology | Admitting: Cardiology

## 2024-02-17 DIAGNOSIS — Z7901 Long term (current) use of anticoagulants: Secondary | ICD-10-CM | POA: Diagnosis not present

## 2024-02-17 DIAGNOSIS — I350 Nonrheumatic aortic (valve) stenosis: Secondary | ICD-10-CM | POA: Diagnosis not present

## 2024-02-17 DIAGNOSIS — D6869 Other thrombophilia: Secondary | ICD-10-CM | POA: Diagnosis not present

## 2024-02-17 DIAGNOSIS — E119 Type 2 diabetes mellitus without complications: Secondary | ICD-10-CM | POA: Insufficient documentation

## 2024-02-17 DIAGNOSIS — I2510 Atherosclerotic heart disease of native coronary artery without angina pectoris: Secondary | ICD-10-CM | POA: Diagnosis not present

## 2024-02-17 DIAGNOSIS — I10 Essential (primary) hypertension: Secondary | ICD-10-CM | POA: Insufficient documentation

## 2024-02-17 DIAGNOSIS — M109 Gout, unspecified: Secondary | ICD-10-CM | POA: Insufficient documentation

## 2024-02-17 DIAGNOSIS — E785 Hyperlipidemia, unspecified: Secondary | ICD-10-CM | POA: Diagnosis not present

## 2024-02-17 DIAGNOSIS — Z952 Presence of prosthetic heart valve: Secondary | ICD-10-CM | POA: Insufficient documentation

## 2024-02-17 DIAGNOSIS — I480 Paroxysmal atrial fibrillation: Secondary | ICD-10-CM | POA: Diagnosis present

## 2024-02-17 DIAGNOSIS — Z79899 Other long term (current) drug therapy: Secondary | ICD-10-CM | POA: Insufficient documentation

## 2024-02-17 DIAGNOSIS — K219 Gastro-esophageal reflux disease without esophagitis: Secondary | ICD-10-CM | POA: Insufficient documentation

## 2024-02-17 DIAGNOSIS — E039 Hypothyroidism, unspecified: Secondary | ICD-10-CM | POA: Diagnosis not present

## 2024-02-17 DIAGNOSIS — I4891 Unspecified atrial fibrillation: Secondary | ICD-10-CM

## 2024-02-17 HISTORY — PX: ATRIAL FIBRILLATION ABLATION: EP1191

## 2024-02-17 LAB — PROTIME-INR
INR: 2.8 — ABNORMAL HIGH (ref 0.8–1.2)
Prothrombin Time: 29.5 s — ABNORMAL HIGH (ref 11.4–15.2)

## 2024-02-17 LAB — GLUCOSE, CAPILLARY
Glucose-Capillary: 101 mg/dL — ABNORMAL HIGH (ref 70–99)
Glucose-Capillary: 163 mg/dL — ABNORMAL HIGH (ref 70–99)

## 2024-02-17 LAB — POCT ACTIVATED CLOTTING TIME
Activated Clotting Time: 308 s
Activated Clotting Time: 308 s

## 2024-02-17 SURGERY — ATRIAL FIBRILLATION ABLATION
Anesthesia: General

## 2024-02-17 MED ORDER — ONDANSETRON HCL 4 MG/2ML IJ SOLN
INTRAMUSCULAR | Status: DC | PRN
  Administered 2024-02-17: 4 mg via INTRAVENOUS

## 2024-02-17 MED ORDER — PROPOFOL 500 MG/50ML IV EMUL
INTRAVENOUS | Status: DC | PRN
  Administered 2024-02-17: 55 ug/kg/min via INTRAVENOUS

## 2024-02-17 MED ORDER — DEXAMETHASONE SODIUM PHOSPHATE 10 MG/ML IJ SOLN
INTRAMUSCULAR | Status: DC | PRN
  Administered 2024-02-17: 10 mg via INTRAVENOUS

## 2024-02-17 MED ORDER — PROPOFOL 10 MG/ML IV BOLUS
INTRAVENOUS | Status: DC | PRN
  Administered 2024-02-17: 40 mg via INTRAVENOUS
  Administered 2024-02-17: 100 mg via INTRAVENOUS
  Administered 2024-02-17: 40 mg via INTRAVENOUS

## 2024-02-17 MED ORDER — HEPARIN (PORCINE) IN NACL 1000-0.9 UT/500ML-% IV SOLN
INTRAVENOUS | Status: DC | PRN
  Administered 2024-02-17 (×3): 500 mL

## 2024-02-17 MED ORDER — HEPARIN SODIUM (PORCINE) 1000 UNIT/ML IJ SOLN
INTRAMUSCULAR | Status: DC | PRN
  Administered 2024-02-17: 10000 [IU] via INTRAVENOUS
  Administered 2024-02-17: 1000 [IU] via INTRAVENOUS
  Administered 2024-02-17: 2000 [IU] via INTRAVENOUS

## 2024-02-17 MED ORDER — CEFAZOLIN SODIUM-DEXTROSE 2-3 GM-%(50ML) IV SOLR
INTRAVENOUS | Status: DC | PRN
  Administered 2024-02-17: 2 g via INTRAVENOUS

## 2024-02-17 MED ORDER — ROCURONIUM BROMIDE 10 MG/ML (PF) SYRINGE
PREFILLED_SYRINGE | INTRAVENOUS | Status: DC | PRN
  Administered 2024-02-17: 20 mg via INTRAVENOUS
  Administered 2024-02-17: 70 mg via INTRAVENOUS
  Administered 2024-02-17: 20 mg via INTRAVENOUS
  Administered 2024-02-17: 30 mg via INTRAVENOUS

## 2024-02-17 MED ORDER — PHENYLEPHRINE 80 MCG/ML (10ML) SYRINGE FOR IV PUSH (FOR BLOOD PRESSURE SUPPORT)
PREFILLED_SYRINGE | INTRAVENOUS | Status: DC | PRN
  Administered 2024-02-17 (×4): 80 ug via INTRAVENOUS

## 2024-02-17 MED ORDER — ATROPINE SULFATE 1 MG/ML IV SOLN
INTRAVENOUS | Status: DC | PRN
  Administered 2024-02-17: 1 mg via INTRAVENOUS

## 2024-02-17 MED ORDER — SUGAMMADEX SODIUM 200 MG/2ML IV SOLN
INTRAVENOUS | Status: DC | PRN
  Administered 2024-02-17: 200 mg via INTRAVENOUS

## 2024-02-17 MED ORDER — PROTAMINE SULFATE 10 MG/ML IV SOLN
INTRAVENOUS | Status: DC | PRN
  Administered 2024-02-17: 35 mg via INTRAVENOUS

## 2024-02-17 MED ORDER — DEXMEDETOMIDINE HCL IN NACL 80 MCG/20ML IV SOLN
INTRAVENOUS | Status: DC | PRN
  Administered 2024-02-17: 12 ug via INTRAVENOUS

## 2024-02-17 MED ORDER — LIDOCAINE 2% (20 MG/ML) 5 ML SYRINGE
INTRAMUSCULAR | Status: DC | PRN
  Administered 2024-02-17: 100 mg via INTRAVENOUS

## 2024-02-17 MED ORDER — SODIUM CHLORIDE 0.9 % IV SOLN: INTRAVENOUS | Status: DC

## 2024-02-17 MED ORDER — FENTANYL CITRATE (PF) 100 MCG/2ML IJ SOLN
INTRAMUSCULAR | Status: AC
Start: 2024-02-17 — End: ?
  Filled 2024-02-17: qty 2

## 2024-02-17 MED ORDER — FENTANYL CITRATE (PF) 250 MCG/5ML IJ SOLN
INTRAMUSCULAR | Status: DC | PRN
  Administered 2024-02-17: 100 ug via INTRAVENOUS

## 2024-02-17 SURGICAL SUPPLY — 22 items
BAG SNAP BAND KOVER 36X36 (MISCELLANEOUS) IMPLANT
BLANKET WARM UNDERBOD FULL ACC (MISCELLANEOUS) ×1 IMPLANT
CABLE PFA RX CATH CONN (CABLE) IMPLANT
CATH 8FR REPROCESSED SOUNDSTAR (CATHETERS) ×1 IMPLANT
CATH 8FR SOUNDSTAR REPROCESSED (CATHETERS) IMPLANT
CATH BI DIR 7FR CS F-J 12 PIN (CATHETERS) IMPLANT
CATH FARAWAVE ABLATION 31 (CATHETERS) IMPLANT
CATH OCTARAY 2.0 F 3-3-3-3-3 (CATHETERS) IMPLANT
CLOSURE PERCLOSE PROSTYLE (VASCULAR PRODUCTS) IMPLANT
COVER SWIFTLINK CONNECTOR (BAG) ×1 IMPLANT
DEVICE CLOSURE MYNXGRIP 6/7F (Vascular Products) IMPLANT
DILATOR VESSEL 38 20CM 16FR (INTRODUCER) IMPLANT
GUIDEWIRE INQWIRE 1.5J.035X260 (WIRE) IMPLANT
INQWIRE 1.5J .035X260CM (WIRE) ×1 IMPLANT
KIT VERSACROSS CNCT FARADRIVE (KITS) IMPLANT
PACK EP LF (CUSTOM PROCEDURE TRAY) ×1 IMPLANT
PAD DEFIB RADIO PHYSIO CONN (PAD) ×1 IMPLANT
PATCH CARTO3 (PAD) IMPLANT
SHEATH FARADRIVE STEERABLE (SHEATH) IMPLANT
SHEATH PINNACLE 8F 10CM (SHEATH) IMPLANT
SHEATH PINNACLE 9F 10CM (SHEATH) IMPLANT
SHEATH PROBE COVER 6X72 (BAG) IMPLANT

## 2024-02-17 NOTE — H&P (Signed)
 Electrophysiology Note:   Date: 02/17/24 ID:  [name] [name], DOB June 08, 1971, MRN [id number]   Primary Cardiologist: None Primary Heart Failure: None Electrophysiologist: Nobie Putnam, MD       History of Present Illness:   [name] [name] is a 53 y.o. male with h/o cuspid aortic valve complicated by aortic stenosis status post mechanical aortic valve repair (07/2016), nonobstructive CAD, paroxysmal atrial fibrillation/flutter, hypertension, hyperlipidemia, type 2 diabetes, hypothyroidism, and gout who is being seen today for evaluation of his atrial fibrillation at the request of Charlsie Quest, NP.   Patient has had palpitations ever since his aortic valve surgery in 2017.  He wore a ZIO monitor in 2021 which showed less than 1% burden of AF.  He endorsed increased frequency of palpitations of late at his last cardiology appointment.  EKG was done which showed atrial fibrillation.  A monitor was then repeated which showed a 35% burden of atrial fibrillation.  His symptoms are palpitations and shortness of breath. He can usually "push through" this and complete his usual activities. He is otherwise doing well with no new or acute complaints today.    Interval: Patient presents today for scheduled catheter ablation. Reports doing relatively well. No new or acute complaints.   Review of systems complete and found to be negative unless listed in HPI.    EP Information / Studies Reviewed:       EKG Interpretation Date/Time:                  Tuesday December 31 2023 10:05:57 EST Ventricular Rate:         57 PR Interval:                 152 QRS Duration:             82 QT Interval:                 408 QTC Calculation:397 R Axis:                         44   Text Interpretation:      Sinus bradycardia When compared with ECG of 30-Oct-2023 09:38, Sinus rhythm has replaced Atrial fibrillation Vent. rate has decreased BY  48 BPM Confirmed by Nobie Putnam [phone]) on 12/31/2023 10:08:17 AM  EKG  10/30/23:     Zio 11/29/23:   The patient was monitored for 12 days, 15 hours.   The predominant rhythm was sinus with an average rate of 62 bpm in sinus (range 43-101 bpm in sinus).   There were occasional PAC's (1.5% burden) and rare PVC's.   8 supraventricular runs were observed, lasting up to 12 beats with a maximum rate of 184 bpm.   Paroxysmal atrial fibrillation occurred, with an average ventricular rate of 106 bpm (range 53-188 bpm).  Longest episode lasted 1 day, 16 hours.  Atrial fibrillation burden was 35%.   No prolonged pause was observed.   Patient triggered events correspond to sinus rhythm, atrial fibrillation, PSVT, PAC's, and PVC's.   Sinus rhythm with paroxysmal atrial fibrillation, PSVT, and occasional PAC's, as detailed above.   Echo 11/20/23: Mildly decreased LV systolic function.  LVEF 45 to 50%. Normal RV size and function. Normal functioning mechanical aortic valve.  Mean gradient 11 mmHg. Left atrium is normal in size.  Right atrium is normal in size.   Risk Assessment/Calculations:     CHA2DS2-VASc Score = 2   This indicates  a 2.2% annual risk of stroke. The patient's score is based upon: CHF History: 0 HTN History: 1 Diabetes History: 1 Stroke History: 0 Vascular Disease History: 0 Age Score: 0 Gender Score: 0               Physical Exam:    Today's Vitals   02/17/24 0605 02/17/24 0609  BP:  105/87  Pulse:  85  Resp:  20  Temp:  98.6 F (37 C)  TempSrc:  Oral  SpO2:  97%  Weight:  95.3 kg  Height:  5\' 6"  (1.676 m)  PainSc: 0-No pain    Body mass index is 33.89 kg/m.   GEN: Well nourished, well developed in no acute distress NECK: No JVD CARDIAC: Normal rate, regular rhythm RESPIRATORY:  Clear to auscultation without rales, wheezing or rhonchi  ABDOMEN: Soft, non-tender, non-distended EXTREMITIES:  No edema; No deformity    ASSESSMENT AND PLAN:   [name] [name] is a 53 y.o. male with h/o cuspid aortic valve complicated by  aortic stenosis status post mechanical aortic valve repair (07/2016), nonobstructive CAD, paroxysmal atrial fibrillation/flutter, hypertension, hyperlipidemia, type 2 diabetes, hypothyroidism, and gout who is being seen for evaluation of his atrial fibrillation at the request of Charlsie Quest, NP.   # Symptomatic paroxysmal atrial fibrillation: Increasing burden, now 35% on recent Zio monitor. -Discussed treatment options today for AF including antiarrhythmic drug therapy with dofetilide and ablation. Discussed risks, recovery and likelihood of success with each treatment strategy. Risk, benefits, and alternatives to EP study and ablation for afib were discussed. These risks include but are not limited to stroke, bleeding, vascular damage, tamponade, perforation, damage to the esophagus, lungs, phrenic nerve and other structures, pulmonary vein stenosis, worsening renal function, coronary vasospasm and death.  Discussed potential need for repeat ablation procedures and antiarrhythmic drugs after an initial ablation. The patient understands these risk and wishes to proceed with ablation today. -Continue metoprolol for now.   # Secondary hypercoagulable state due to atrial fibrillation:  -CHA2DS2-VASc score of 3. -Continue warfarin.  He is on this for mechanical aortic valve prosthesis as well.    Signed, Nobie Putnam, MD

## 2024-02-17 NOTE — Anesthesia Procedure Notes (Signed)
 Procedure Name: Intubation Date/Time: 02/17/2024 7:45 AM  Performed by: Vena Austria, CRNAPre-anesthesia Checklist: Patient identified, Emergency Drugs available, Suction available, Patient being monitored and Timeout performed Patient Re-evaluated:Patient Re-evaluated prior to induction Oxygen Delivery Method: Circle system utilized Preoxygenation: Pre-oxygenation with 100% oxygen Induction Type: IV induction Ventilation: Mask ventilation without difficulty Laryngoscope Size: McGrath and 3 Grade View: Grade I Tube size: 7.0 mm Number of attempts: 1 Airway Equipment and Method: Stylet and Video-laryngoscopy Placement Confirmation: ETT inserted through vocal cords under direct vision, positive ETCO2, CO2 detector and breath sounds checked- equal and bilateral Secured at: 21 cm Tube secured with: Tape

## 2024-02-17 NOTE — Transfer of Care (Signed)
 Immediate Anesthesia Transfer of Care Note  Patient: [name] [name]  Procedure(s) Performed: ATRIAL FIBRILLATION ABLATION  Patient Location: PACU and Cath Lab  Anesthesia Type:General  Level of Consciousness: drowsy  Airway & Oxygen Therapy: Patient Spontanous Breathing and Patient connected to nasal cannula oxygen  Post-op Assessment: Report given to RN and Post -op Vital signs reviewed and stable  Post vital signs: stable  Last Vitals:  Vitals Value Taken Time  BP 102/69 02/17/24 1005  Temp    Pulse 70 02/17/24 1006  Resp 22 02/17/24 1006  SpO2 95 % 02/17/24 1006  Vitals shown include unfiled device data.  Last Pain:  Vitals:   02/17/24 0609  TempSrc: Oral  PainSc:          Complications: There were no known notable events for this encounter.

## 2024-02-17 NOTE — Anesthesia Postprocedure Evaluation (Signed)
 Anesthesia Post Note  Patient: [name] [name]  Procedure(s) Performed: ATRIAL FIBRILLATION ABLATION     Patient location during evaluation: PACU Anesthesia Type: General Level of consciousness: awake and alert and oriented Pain management: pain level controlled Vital Signs Assessment: post-procedure vital signs reviewed and stable Respiratory status: spontaneous breathing, nonlabored ventilation and respiratory function stable Cardiovascular status: blood pressure returned to baseline and stable Postop Assessment: no apparent nausea or vomiting Anesthetic complications: no   There were no known notable events for this encounter.  Last Vitals:  Vitals:   02/17/24 1035 02/17/24 1045  BP:  (!) 84/68  Pulse: 63 61  Resp: 15 15  Temp: (!) 36.4 C   SpO2: 97% 96%    Last Pain:  Vitals:   02/17/24 1035  TempSrc: Axillary  PainSc: 0-No pain                 Nohely Whitehorn A.

## 2024-02-17 NOTE — Discharge Instructions (Signed)

## 2024-02-18 ENCOUNTER — Encounter (HOSPITAL_COMMUNITY): Payer: Self-pay | Admitting: Cardiology

## 2024-02-18 ENCOUNTER — Telehealth (HOSPITAL_COMMUNITY): Payer: Self-pay

## 2024-02-18 NOTE — Telephone Encounter (Signed)
 Spoke with patient's wife [name] [name] to complete post procedure follow up call.  Kim reports no complications with groin sites.   Instructions reviewed:  Remove large bandage at puncture site after 24 hours. It is normal to have bruising, tenderness and a pea or marble sized lump/knot at the groin site which can take up to three months to resolve.  Get help right away if you notice sudden swelling at the puncture site.  Check your puncture site every day for signs of infection: fever, redness, swelling, pus drainage, warmth, foul odor or excessive pain. If this occurs, please call the office at [phone], to speak with the nurse. Get help right away if your puncture site is bleeding and the bleeding does not stop after applying firm pressure to the area.  You may continue to have skipped beats/ atrial fibrillation during the first several months after your procedure.  It is very important not to miss any doses of your blood thinner Warfarin. Patient restarted taking this medication on yesterday, 02/17/24   You will follow up with the Afib clinic on 03/16/24 and follow up with the APP on 05/19/24. Patient's wife states patient is unavailable for both appointment dates due to work schedule. Both appointments rescheduled to 03/18/24 and 05/20/24 per request.   Patient's wife verbalized understanding to all instructions provided.

## 2024-02-19 MED FILL — Fentanyl Citrate Preservative Free (PF) Inj 100 MCG/2ML: INTRAMUSCULAR | Qty: 2 | Status: AC

## 2024-02-20 ENCOUNTER — Encounter: Payer: Self-pay | Admitting: Emergency Medicine

## 2024-02-26 ENCOUNTER — Ambulatory Visit: Payer: BC Managed Care – PPO | Attending: Internal Medicine | Admitting: *Deleted

## 2024-02-26 DIAGNOSIS — Z952 Presence of prosthetic heart valve: Secondary | ICD-10-CM | POA: Diagnosis not present

## 2024-02-26 DIAGNOSIS — Z5181 Encounter for therapeutic drug level monitoring: Secondary | ICD-10-CM

## 2024-02-26 LAB — POCT INR: INR: 4.2 — AB (ref 2.0–3.0)

## 2024-02-26 NOTE — Patient Instructions (Signed)
 Hold warfarin tonight then resume 1.5 tablets daily except 1 tablet on Tuesdays and Fridays  Recheck INR in 1 week  S/P ablation 02/17/24

## 2024-03-04 ENCOUNTER — Ambulatory Visit: Attending: Internal Medicine | Admitting: *Deleted

## 2024-03-04 DIAGNOSIS — Z5181 Encounter for therapeutic drug level monitoring: Secondary | ICD-10-CM | POA: Diagnosis not present

## 2024-03-04 DIAGNOSIS — Z952 Presence of prosthetic heart valve: Secondary | ICD-10-CM

## 2024-03-04 LAB — POCT INR: INR: 3.9 — AB (ref 2.0–3.0)

## 2024-03-04 NOTE — Patient Instructions (Signed)
 Take warfarin 1/2 tablet tonight then resume 1.5 tablets daily except 1 tablet on Tuesdays and Fridays  Recheck INR in 3 week  S/P ablation 02/17/24

## 2024-03-16 ENCOUNTER — Ambulatory Visit (HOSPITAL_COMMUNITY): Admitting: Physician Assistant

## 2024-03-18 ENCOUNTER — Ambulatory Visit (HOSPITAL_COMMUNITY)
Admission: RE | Admit: 2024-03-18 | Discharge: 2024-03-18 | Disposition: A | Discharge status: Home / Self Care | Source: Ambulatory Visit | Attending: Physician Assistant | Admitting: Physician Assistant

## 2024-03-18 ENCOUNTER — Encounter (HOSPITAL_COMMUNITY): Payer: Self-pay | Admitting: Physician Assistant

## 2024-03-18 VITALS — BP 114/80 | HR 62 | Ht 66.0 in | Wt 215.4 lb

## 2024-03-18 DIAGNOSIS — Z79899 Other long term (current) drug therapy: Secondary | ICD-10-CM | POA: Diagnosis not present

## 2024-03-18 DIAGNOSIS — D6869 Other thrombophilia: Secondary | ICD-10-CM

## 2024-03-18 DIAGNOSIS — E039 Hypothyroidism, unspecified: Secondary | ICD-10-CM | POA: Insufficient documentation

## 2024-03-18 DIAGNOSIS — E785 Hyperlipidemia, unspecified: Secondary | ICD-10-CM | POA: Insufficient documentation

## 2024-03-18 DIAGNOSIS — I110 Hypertensive heart disease with heart failure: Secondary | ICD-10-CM | POA: Insufficient documentation

## 2024-03-18 DIAGNOSIS — I2510 Atherosclerotic heart disease of native coronary artery without angina pectoris: Secondary | ICD-10-CM | POA: Insufficient documentation

## 2024-03-18 DIAGNOSIS — I4892 Unspecified atrial flutter: Secondary | ICD-10-CM | POA: Diagnosis not present

## 2024-03-18 DIAGNOSIS — Z7984 Long term (current) use of oral hypoglycemic drugs: Secondary | ICD-10-CM | POA: Insufficient documentation

## 2024-03-18 DIAGNOSIS — I5022 Chronic systolic (congestive) heart failure: Secondary | ICD-10-CM | POA: Insufficient documentation

## 2024-03-18 DIAGNOSIS — I480 Paroxysmal atrial fibrillation: Secondary | ICD-10-CM | POA: Diagnosis not present

## 2024-03-18 DIAGNOSIS — Z7901 Long term (current) use of anticoagulants: Secondary | ICD-10-CM | POA: Insufficient documentation

## 2024-03-18 DIAGNOSIS — Z952 Presence of prosthetic heart valve: Secondary | ICD-10-CM | POA: Insufficient documentation

## 2024-03-18 DIAGNOSIS — E119 Type 2 diabetes mellitus without complications: Secondary | ICD-10-CM | POA: Diagnosis not present

## 2024-03-18 DIAGNOSIS — Q2381 Bicuspid aortic valve: Secondary | ICD-10-CM | POA: Diagnosis not present

## 2024-03-18 NOTE — Progress Notes (Signed)
 Primary Care Physician: Tacy Learn, FNP Primary Cardiologist: Yvonne Kendall, MD Electrophysiologist: Nobie Putnam, MD  Referring Physician: Dr Tobey Bride is a 53 y.o. male with a history of bicuspid AV s/p mechanical AVR, CAD, HTN, HLD, DM, hypothyroidism, atrial flutter, atrial fibrillation who presents for follow up in the [facility].  Patient has had palpitations ever since his aortic valve surgery in 2017. He wore a ZIO monitor in 2021 which showed less than 1% burden of AF. He endorsed increased frequency of palpitations at a cardiology appointment. EKG was done which showed atrial fibrillation. A monitor was then repeated which showed a 35% burden of atrial fibrillation. He was seen by Dr Jimmey Ralph and underwent afib ablation on 02/17/24. Patient is on warfarin for stroke prevention.   Patient presents today for follow up for atrial fibrillation. He reports that he has done well since the ablation with no interim symptoms of afib. He denies chest pain. He did have a hematoma at his left groin site which is resolving. No bleeding issues on anticoagulation.   Today, he denies symptoms of palpitations, chest pain, shortness of breath, orthopnea, PND, lower extremity edema, dizziness, presyncope, syncope, snoring, daytime somnolence, bleeding, or neurologic sequela. The patient is tolerating medications without difficulties and is otherwise without complaint today.    Atrial Fibrillation Risk Factors:  he does not have symptoms or diagnosis of sleep apnea. he does not have a history of rheumatic fever.   Atrial Fibrillation Management history:  Previous antiarrhythmic drugs: none Previous cardioversions: none Previous ablations: 02/17/24 Anticoagulation history: warfarin   ROS- All systems are reviewed and negative except as per the HPI above.  Past Medical History:  Diagnosis Date   Aortic stenosis 07/09/2016   Arthritis     takes Indomethacin daily   Atrial fibrillation and flutter (HCC)    Complication of anesthesia    wife states he gets extremely disoriented   Dizziness    GERD (gastroesophageal reflux disease)    on occasion-takes Ranitidine if needed   Gout    takes Allopurinol daily   Headache    Heart murmur    History of kidney stones    Hyperlipidemia    takes Lipitor daily   Hypertension    takes Lisinopril and Coreg daily   Shortness of breath dyspnea    with exertion    Current Outpatient Medications  Medication Sig Dispense Refill   allopurinol (ZYLOPRIM) 300 MG tablet Take 300 mg by mouth in the morning.     Ascorbic Acid (VITAMIN C PO) Take 1,000 mg by mouth in the morning.     aspirin 81 MG tablet Take 81 mg by mouth in the morning.     atorvastatin (LIPITOR) 40 MG tablet Take 40 mg by mouth at bedtime.     fluconazole (DIFLUCAN) 150 MG tablet Take 150 mg by mouth daily as needed (Skin out break).     indomethacin (INDOCIN) 50 MG capsule Take 1 capsule (50 mg total) by mouth daily as needed (for gout flare up). (Patient taking differently: Take 50 mg by mouth daily as needed (for gout flare up). Hold allopurinol) 30 capsule 1   levothyroxine (SYNTHROID) 25 MCG tablet Take 25 mcg by mouth daily before breakfast. 30 min before food or drink     lisinopril (ZESTRIL) 5 MG tablet Take 1 tablet (5 mg total) by mouth daily. 90 tablet 3   metFORMIN (GLUCOPHAGE-XR) 500 MG 24 hr tablet Take  500 mg by mouth at bedtime.     metoprolol tartrate (LOPRESSOR) 25 MG tablet Take 1.5 tablets (37.5 mg) by mouth in the AM and 2 tablets (50 mg) by mouth in the evening (Patient taking differently: Take 37.5-50 mg by mouth See admin instructions. Take 37.5 mg by mouth in the AM ,and 50 mg by mouth in the PM) 315 tablet 3   Multiple Vitamin (MULTIVITAMIN) tablet Take 1 tablet by mouth daily.     warfarin (COUMADIN) 5 MG tablet Take 1.5 tablets by mouth daily by mouth daily except 1 tablet on Tuesday and Thursday  or as directed by Coumadin Clinic (Patient taking differently: Take 5-7.5 mg by mouth See admin instructions. Take 7.5 mg  by mouth daily by mouth daily except,Tuesday and Friday take 5 mg or as directed by Coumadin Clinic) 120 tablet 1   No current facility-administered medications for this encounter.    Physical Exam: BP 114/80   Pulse 62   Ht 5\' 6"  (1.676 m)   Wt 97.7 kg   BMI 34.77 kg/m   GEN: Well nourished, well developed in no acute distress CARDIAC: Regular rate and rhythm, no murmurs, rubs, gallops, mech click RESPIRATORY:  Clear to auscultation without rales, wheezing or rhonchi  ABDOMEN: Soft, non-tender, non-distended EXTREMITIES:  No edema; No deformity   Wt Readings from Last 3 Encounters:  03/18/24 97.7 kg  02/17/24 95.3 kg  12/31/23 96.3 kg     EKG today demonstrates  SR Vent. rate 62 BPM PR interval 152 ms QRS duration 88 ms QT/QTcB 412/418 ms   Echo 11/20/23 demonstrated   1. Left ventricular ejection fraction, by estimation, is 45 to 50%. The  left ventricle has mildly decreased function. Left ventricular endocardial  border not optimally defined to evaluate regional wall motion. Left  ventricular diastolic function could not be evaluated.   2. Right ventricular systolic function is normal. The right ventricular  size is normal. Tricuspid regurgitation signal is inadequate for assessing  PA pressure.   3. The mitral valve is grossly normal. Trivial mitral valve  regurgitation. No evidence of mitral stenosis.   4. The aortic valve has been repaired/replaced. Aortic valve  regurgitation is trivial. No aortic stenosis is present. There is a 25 mm  [employer] mechanical valve present in the aortic position. Procedure  Date: 08/16/2016. Aortic valve mean gradient  measures 11.0 mmHg.   5. Aortic dilatation noted. There is mild dilatation of the aortic root,  measuring 42 mm.   Comparison(s): No prior Echocardiogram post-SAVR.    CHA2DS2-VASc Score =  2  The patient's score is based upon: CHF History: 0 HTN History: 1 Diabetes History: 1 Stroke History: 0 Vascular Disease History: 0 Age Score: 0 Gender Score: 0       ASSESSMENT AND PLAN: Paroxysmal Atrial Fibrillation/atrial flutter The patient's CHA2DS2-VASc score is 2, indicating a 2.2% annual risk of stroke.   S/p afib ablation 02/17/24 Patient appears to be maintaining SR Continue Lopressor 37.5 mg BID Continue warfarin   Secondary Hypercoagulable State (ICD10:  D68.69) The patient is at significant risk for stroke/thromboembolism based upon his CHA2DS2-VASc Score of 2.  Continue Warfarin (Coumadin). No bleeding issues.   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  Jorja Loa PA-C Afib Clinic [facility] [address] [place], [place] [address] [phone]

## 2024-03-24 ENCOUNTER — Ambulatory Visit: Attending: Internal Medicine | Admitting: *Deleted

## 2024-03-24 DIAGNOSIS — Z952 Presence of prosthetic heart valve: Secondary | ICD-10-CM | POA: Diagnosis not present

## 2024-03-24 DIAGNOSIS — Z5181 Encounter for therapeutic drug level monitoring: Secondary | ICD-10-CM | POA: Diagnosis not present

## 2024-03-24 LAB — POCT INR: INR: 4.2 — AB (ref 2.0–3.0)

## 2024-03-24 NOTE — Patient Instructions (Signed)
 Hold warfarin tonight then resume 1.5 tablets daily except 1 tablet on Tuesdays and Fridays Increase salads/greens  Recheck INR in 3 week  S/P ablation 02/17/24

## 2024-04-15 ENCOUNTER — Ambulatory Visit: Attending: Internal Medicine

## 2024-04-15 ENCOUNTER — Encounter: Payer: Self-pay | Admitting: Internal Medicine

## 2024-04-15 DIAGNOSIS — Z7901 Long term (current) use of anticoagulants: Secondary | ICD-10-CM

## 2024-04-15 DIAGNOSIS — Z952 Presence of prosthetic heart valve: Secondary | ICD-10-CM | POA: Diagnosis not present

## 2024-04-15 LAB — POCT INR: INR: 3.6 — AB (ref 2.0–3.0)

## 2024-04-15 NOTE — Patient Instructions (Signed)
 Description   Take 1 tablet today then resume 1.5 tablets daily except 1 tablet on Tuesdays and Fridays Increase salads/greens  Recheck INR in 4 week  S/P ablation 02/17/24

## 2024-04-21 ENCOUNTER — Telehealth: Payer: Self-pay | Admitting: Cardiology

## 2024-04-21 NOTE — Telephone Encounter (Signed)
 Spoke with patient's wife, Burdette Carolin. She states she wants to talk with Ronald Cockayne, NP regarding patient's follow up in August. She would like for patient to come with her in November. Patient's wife requests a call back tomorrow to discuss follow up appointment.

## 2024-04-21 NOTE — Telephone Encounter (Signed)
-----   Message from [facility] sent at 04/15/2024  9:48 AM EDT ----- Regarding: follow ups Sabrina, This guys schedule is a hot mess. Can you help clean it up?  Can he be seen in [place] at A-fib clinic on 05/13/24 versus here and had his coumadin  clinic visit there on the same day? He is already scheduled 05/23/24 for A-fib clinic but is unable to make that appointment due to his work schedule. His wife has sent several messages and noting is being changed in the system. His wife said if he is unable to see a fib on 5/28 his next day off is 6/14.  His appointment here can be pushed out to 6 months from the last time he was seen in Feb.  Please and thank you  Arlyce Berger

## 2024-05-13 ENCOUNTER — Encounter

## 2024-05-13 ENCOUNTER — Ambulatory Visit: Attending: Internal Medicine | Admitting: *Deleted

## 2024-05-13 ENCOUNTER — Ambulatory Visit: Payer: BC Managed Care – PPO | Admitting: Cardiology

## 2024-05-13 DIAGNOSIS — Z952 Presence of prosthetic heart valve: Secondary | ICD-10-CM

## 2024-05-13 DIAGNOSIS — Z5181 Encounter for therapeutic drug level monitoring: Secondary | ICD-10-CM | POA: Diagnosis not present

## 2024-05-13 LAB — POCT INR: INR: 4.5 — AB (ref 2.0–3.0)

## 2024-05-13 NOTE — Patient Instructions (Signed)
 Hold warfarin tonight then resume 1.5 tablets daily except 1 tablet on Tuesdays and Fridays Increase salads/greens  Recheck INR in 4 weeks S/P ablation 02/17/24

## 2024-05-18 ENCOUNTER — Encounter (HOSPITAL_BASED_OUTPATIENT_CLINIC_OR_DEPARTMENT_OTHER): Payer: Self-pay

## 2024-05-19 ENCOUNTER — Ambulatory Visit: Admitting: Student

## 2024-05-19 NOTE — Progress Notes (Unsigned)
  Electrophysiology Office Note:   Date:  05/20/2024  ID:  Arthea Bilis, DOB 1971/06/01, MRN [id number]  Primary Cardiologist: Sammy Crisp, MD Electrophysiologist: Ardeen Kohler, MD      History of Present Illness:   [name] [name] is a 53 y.o. male with h/o bicuspid AV s/p mechanical AVR, CAD, HTN, HLD, and PAF/AFL s/p ablation seen today for routine electrophysiology follow-up s/p Ablation.  Since last being seen in our clinic the patient reports doing very well. No breakthrough AF. Overall, he denies chest pain, palpitations, dyspnea, PND, orthopnea, nausea, vomiting, dizziness, syncope, edema, weight gain, or early satiety.    Review of systems complete and found to be negative unless listed in HPI.   EP Information / Studies Reviewed:    EKG is ordered today. Personal review as below.  EKG Interpretation Date/Time:  Wednesday May 20 2024 09:39:37 EDT Ventricular Rate:  61 PR Interval:  156 QRS Duration:  94 QT Interval:  414 QTC Calculation: 416 R Axis:   48  Text Interpretation: Normal sinus rhythm Normal ECG When compared with ECG of 18-Mar-2024 09:32, No significant change was found Confirmed by Pilar Bridge [phone]) on 05/20/2024 9:52:03 AM    Arrhythmia/Device History S/p PVI and posterior wall ablation 02/17/2024   Physical Exam:   VS:  BP 112/76 (BP Location: Left Arm, Patient Position: Sitting, Cuff Size: Normal)   Pulse 61   Ht 5\' 5"  (1.651 m)   Wt 228 lb 8 oz (103.6 kg)   SpO2 94%   BMI 38.02 kg/m    Wt Readings from Last 3 Encounters:  05/20/24 228 lb 8 oz (103.6 kg)  03/18/24 215 lb 6.4 oz (97.7 kg)  02/17/24 210 lb (95.3 kg)     GEN: No acute distress NECK: No JVD; No carotid bruits CARDIAC: Regular rate and rhythm, no murmurs, rubs, gallops RESPIRATORY:  Clear to auscultation without rales, wheezing or rhonchi  ABDOMEN: Soft, non-tender, non-distended EXTREMITIES:  No edema; No deformity   ASSESSMENT AND PLAN:    Paroxysmal AF S/p AF  ablation 02/17/2024 CHA2DS2VASc of at least 3 (CAD, HTN, and DM2) Flutter has been mentioned but is not seen on EKGs Continue coumadin    S/p Mechanical AVR Secondary hypercoagulable state Pt on Coumadin  as above   HTN Stable on current regimen   Follow up with EP Team in 6 months, can likely transition to prn with EP after that  Signed, Tylene Galla, PA-C

## 2024-05-20 ENCOUNTER — Encounter: Payer: Self-pay | Admitting: Student

## 2024-05-20 ENCOUNTER — Ambulatory Visit: Attending: Student | Admitting: Student

## 2024-05-20 VITALS — BP 112/76 | HR 61 | Ht 65.0 in | Wt 228.5 lb

## 2024-05-20 DIAGNOSIS — I480 Paroxysmal atrial fibrillation: Secondary | ICD-10-CM | POA: Diagnosis not present

## 2024-05-20 DIAGNOSIS — Z7901 Long term (current) use of anticoagulants: Secondary | ICD-10-CM

## 2024-05-20 DIAGNOSIS — I10 Essential (primary) hypertension: Secondary | ICD-10-CM

## 2024-05-20 DIAGNOSIS — Z952 Presence of prosthetic heart valve: Secondary | ICD-10-CM

## 2024-05-20 NOTE — Patient Instructions (Signed)
 Medication Instructions:  No medication changes today. *If you need a refill on your cardiac medications before your next appointment, please call your pharmacy*  Lab Work: No labwork ordered today. If you have labs (blood work) drawn today and your tests are completely normal, you will receive your results only by: MyChart Message (if you have MyChart) OR A paper copy in the mail If you have any lab test that is abnormal or we need to change your treatment, we will call you to review the results.  Testing/Procedures: No testing ordered today  Follow-Up: At [facility], you and your health needs are our priority.  As part of our continuing mission to provide you with exceptional heart care, our providers are all part of one team.  This team includes your primary Cardiologist (physician) and Advanced Practice Providers or APPs (Physician Assistants and Nurse Practitioners) who all work together to provide you with the care you need, when you need it.  Your next appointment:   6 month(s)  Provider:   You may see Ardeen Kohler, MD or one of the following Advanced Practice Providers on your designated Care Team:   Mertha Abrahams, Kennard Pea "Jonelle Neri" [place], PA-C Suzann Riddle, NP Creighton Doffing, NP    We recommend signing up for the patient portal called "MyChart".  Sign up information is provided on this After Visit Summary.  MyChart is used to connect with patients for Virtual Visits (Telemedicine).  Patients are able to view lab/test results, encounter notes, upcoming appointments, etc.  Non-urgent messages can be sent to your provider as well.   To learn more about what you can do with MyChart, go to [email or web address].

## 2024-06-01 ENCOUNTER — Other Ambulatory Visit: Payer: Self-pay

## 2024-06-01 DIAGNOSIS — I4891 Unspecified atrial fibrillation: Secondary | ICD-10-CM

## 2024-06-01 MED ORDER — WARFARIN SODIUM 5 MG PO TABS
ORAL_TABLET | ORAL | 1 refills | Status: DC
Start: 2024-06-01 — End: 2024-11-05

## 2024-06-02 ENCOUNTER — Other Ambulatory Visit: Payer: Self-pay

## 2024-06-02 MED ORDER — METOPROLOL TARTRATE 25 MG PO TABS
ORAL_TABLET | ORAL | 3 refills | Status: AC
Start: 2024-06-02 — End: ?

## 2024-06-10 ENCOUNTER — Ambulatory Visit: Attending: Internal Medicine | Admitting: *Deleted

## 2024-06-10 DIAGNOSIS — Z5181 Encounter for therapeutic drug level monitoring: Secondary | ICD-10-CM

## 2024-06-10 DIAGNOSIS — Z952 Presence of prosthetic heart valve: Secondary | ICD-10-CM | POA: Diagnosis not present

## 2024-06-10 LAB — POCT INR: INR: 3.4 — AB (ref 2.0–3.0)

## 2024-06-10 NOTE — Progress Notes (Signed)
Please see anticoagulation encounter.

## 2024-06-10 NOTE — Patient Instructions (Signed)
 Continue warfarin 1.5 tablets daily except 1 tablet on Tuesdays and Fridays Continue salads/greens  Recheck INR in 4 weeks S/P ablation 02/17/24

## 2024-07-15 ENCOUNTER — Ambulatory Visit: Attending: Internal Medicine | Admitting: *Deleted

## 2024-07-15 DIAGNOSIS — Z952 Presence of prosthetic heart valve: Secondary | ICD-10-CM

## 2024-07-15 DIAGNOSIS — Z5181 Encounter for therapeutic drug level monitoring: Secondary | ICD-10-CM

## 2024-07-15 LAB — POCT INR: INR: 4.6 — AB (ref 2.0–3.0)

## 2024-07-15 NOTE — Progress Notes (Signed)
 INR 4.6 Please see anticoagulation encounter

## 2024-07-15 NOTE — Patient Instructions (Signed)
 Hold warfarin tonight, take 1 tablet tomorrow then resume 1.5 tablets daily except 1 tablet on Tuesdays and Fridays Continue salads/greens  Recheck INR in 4 weeks S/P ablation 02/17/24

## 2024-08-10 ENCOUNTER — Ambulatory Visit: Attending: Internal Medicine | Admitting: *Deleted

## 2024-08-10 DIAGNOSIS — Z952 Presence of prosthetic heart valve: Secondary | ICD-10-CM

## 2024-08-10 DIAGNOSIS — Z7901 Long term (current) use of anticoagulants: Secondary | ICD-10-CM | POA: Diagnosis not present

## 2024-08-10 LAB — POCT INR: INR: 5.2 — AB (ref 2.0–3.0)

## 2024-08-10 NOTE — Progress Notes (Signed)
 INR 5.2; Please see anticoagulation encounter

## 2024-08-10 NOTE — Patient Instructions (Signed)
 Hold warfarin tonight and tomorrow night then resume 1.5 tablets daily except 1 tablet on Tuesdays and Fridays Continue salads/greens  Recheck INR in 3 weeks S/P ablation 02/17/24

## 2024-09-02 ENCOUNTER — Encounter

## 2024-09-09 ENCOUNTER — Ambulatory Visit: Attending: Internal Medicine | Admitting: *Deleted

## 2024-09-09 DIAGNOSIS — Z952 Presence of prosthetic heart valve: Secondary | ICD-10-CM

## 2024-09-09 LAB — POCT INR: INR: 5.6 — AB (ref 2.0–3.0)

## 2024-09-09 NOTE — Patient Instructions (Signed)
 Hold warfarin tonight and tomorrow night then resume 1.5 tablets daily except 1 tablet on Mondays, Wednesdays and Fridays Continue salads/greens  Recheck INR in 3 weeks S/P ablation 02/17/24

## 2024-09-09 NOTE — Progress Notes (Signed)
 INR 5.6. Please see anticoagulation encounter

## 2024-09-30 ENCOUNTER — Encounter

## 2024-10-07 ENCOUNTER — Ambulatory Visit: Attending: Internal Medicine | Admitting: *Deleted

## 2024-10-07 DIAGNOSIS — Z5181 Encounter for therapeutic drug level monitoring: Secondary | ICD-10-CM

## 2024-10-07 DIAGNOSIS — Z952 Presence of prosthetic heart valve: Secondary | ICD-10-CM | POA: Diagnosis not present

## 2024-10-07 LAB — POCT INR: INR: 3.8 — AB (ref 2.0–3.0)

## 2024-10-07 NOTE — Progress Notes (Signed)
 INR 3.8 Please see anticoagulation encounter

## 2024-10-07 NOTE — Patient Instructions (Signed)
 Pt is taking warfarin 1 tablet daily except 1 1/2 tablets on Mondays, Wednesday and Fridays Changed by GORMAN Lunch NP Take warfarin 1 tablet tonight then resume above dose. Continue salads/greens  Recheck INR in 4 weeks S/P ablation 02/17/24

## 2024-10-21 ENCOUNTER — Ambulatory Visit: Attending: Cardiology | Admitting: Cardiology

## 2024-10-21 ENCOUNTER — Encounter: Payer: Self-pay | Admitting: Cardiology

## 2024-10-21 VITALS — BP 110/80 | HR 55 | Ht 67.0 in | Wt 230.5 lb

## 2024-10-21 DIAGNOSIS — Q2381 Bicuspid aortic valve: Secondary | ICD-10-CM

## 2024-10-21 DIAGNOSIS — I10 Essential (primary) hypertension: Secondary | ICD-10-CM

## 2024-10-21 DIAGNOSIS — E785 Hyperlipidemia, unspecified: Secondary | ICD-10-CM

## 2024-10-21 DIAGNOSIS — E039 Hypothyroidism, unspecified: Secondary | ICD-10-CM

## 2024-10-21 DIAGNOSIS — G4730 Sleep apnea, unspecified: Secondary | ICD-10-CM

## 2024-10-21 DIAGNOSIS — I4892 Unspecified atrial flutter: Secondary | ICD-10-CM | POA: Diagnosis not present

## 2024-10-21 DIAGNOSIS — Z952 Presence of prosthetic heart valve: Secondary | ICD-10-CM | POA: Diagnosis not present

## 2024-10-21 DIAGNOSIS — I4891 Unspecified atrial fibrillation: Secondary | ICD-10-CM

## 2024-10-21 DIAGNOSIS — E1169 Type 2 diabetes mellitus with other specified complication: Secondary | ICD-10-CM

## 2024-10-21 DIAGNOSIS — I2510 Atherosclerotic heart disease of native coronary artery without angina pectoris: Secondary | ICD-10-CM

## 2024-10-21 NOTE — Progress Notes (Signed)
 Cardiology Office Note   Date:  10/21/2024  ID:  [name] [name], DOB 1971/04/12, MRN [id number] PCP: Erskine Neptune, FNP  [facility] Providers Cardiologist:  Lonni Hanson, MD Electrophysiologist:  Fonda Kitty, MD     History of Present Illness [name] [name] is a 53 y.o. male with past medical history of aortic stenosis status post mechanical aortic valve repair for bicuspid valve (07/2016), nonobstructive coronary artery disease, paroxysmal atrial fibrillation/flutter, hypertension, hyperlipidemia, type 2 diabetes, hypothyroidism, and gout, who is here today for follow-up.   Patient was originally found to have a cardiac murmur in 2010.  At that time an echocardiogram with demonstrated no significant valvular abnormalities according to the patient.  He was asymptomatic and no further records were performed for recent onset of symptoms with dyspnea on exertion and with 3 episodes of exertional presyncope or dizziness.  Patient denies any rest symptoms of chest pain, orthopnea, PND or ankle edema.  There is no family history of aortic valve disease.  On repeat echocardiogram he was found to have a bicuspid aortic valve.  Right and left heart catheter showed mildly dilated ascending aorta well-preserved LV systolic function.  Aortic valve area was 0.8 with a heavily calcified aortic valve.  LVEDP was measured at 20 mmHg.  Cardiac output was 5.5 L/min.  There were no significant coronary artery lesions.  He underwent a CT scan of the chest which demonstrated ascending aorta diameter 4.2 cm which would not require replacement but he had a large PSA.  He was examined by his dentist documentation that his olanzapine is adequate for valve replacement surgery.  He was admitted to [facility] on 8/29 2017 and underwent aortic valve replacement with mechanical valve.  He tolerated the procedure well with no immediate postoperative complications and was transferred to ICU.  He  remained hemodynamically stable however he did require pressor support at the time.  He was started on Coumadin  for anticoagulation for his mechanical valve.  He was continued on diuretic regimen due to fluid overload.  He progressed appropriately and chest tubes were removed.  On postop day 3 he was transferred to telemetry unit.  He continued to progress without any further issues and was considered stable for discharge from the hospital in 08/19/2016.  He is continue to have surveillance echocardiograms yearly since his aortic valve replacement and is continued on Coumadin  therapy without incident.  Previously was seen in clinic 09/26/2022 by Dr. Hanson.  At that time he was doing well from a cardiac perspective without any complaints.  He continues to note occasional palpitations.  The episodes been present ever since his aortic valve replaced in 2017.  Echocardiogram completed in 06/20/2022 revealed an LVEF 55 to 60%, normal wall motion, mild to moderate RV dilatation and normal function, mild biatrial enlargement, mechanical aortic valve prosthesis present with mild regurgitation and mild mitral annular calcifications with mild MR and mild TR.  Borderline dilated aortic root measuring 4.2 cm.  Continue to stressed the importance of SBE prophylaxis with dental cleanings.  Also discussed if he continues with sporadic palpitations we would send him for EP referral for possible ablation procedure.  He was seen in clinic 10/30/2023 overall was doing well he noted an increased amount of palpitations primarily interfere with him nightly.  His Coumadin  was followed by Coumadin  clinic.  He was scheduled for an updated echocardiogram was placed on a ZIO XT monitor for 2 weeks to reassess his burden of atrial fibrillation as well as  his metoprolol  was increased to 37.5 mg a.m. and 50 mg in the p.m.  His repeat monitor revealed 35% burden of atrial fibrillation and he continued to remain symptomatic.  He was referred to EP  and followed up with Dr. Kennyth on 12/31/2023.  EKG at that time revealed sinus bradycardia at a rate of 57.  He was scheduled for the ablation procedure.  He was evaluated in A-fib clinic 03/18/24 status post his ablation procedure that was completed on 03/05/2024.  He reported that he done well since the ablation with no interim symptoms of atrial fibrillation.  Denied any chest pain.  He did have a hematoma to his left groin site which was resolving.  There were no bleeding issues on anticoagulation.   He was last seen in A-fib clinic 05/20/24.  At that time he was doing well from the cardiac perspective without any complaints.  He had been continued on his current medication regimen without any changes.  There were no further testing that was ordered.  Flutter had been mentioned but was not seen on EKGs.  He was continued on Coumadin  per Coumadin  clinic recommendations.  He would follow-up with the EP team in 6 months then likely transition to as needed EP evaluation after that.  He returns to clinic today accompanied by his wife.  From the cardiac perspective he states that he has been doing well.  He denies any reoccurrence of his palpitations.  Denies any chest pain or shortness of breath.  He states that he has not been as active and is starting to gain back with the weight that he had lost.  According to his wife his snoring more in the evening and she has questions about sleep apnea.  He states that he has been compliant with his medication regimen.  Has not had any issues with bleeding on his warfarin.  Denies any blood in his stool or urine.  Recently had labs completed by his PCP and we are awaiting those results.  Denies any hospitalizations or visits to the emergency department.  ROS: 10 point review of system has been reviewed and considered negative except ones been listed in the HPI  Studies Reviewed EKG Interpretation Date/Time:  Wednesday October 21 2024 08:36:00 EST Ventricular Rate:   55 PR Interval:  144 QRS Duration:  86 QT Interval:  426 QTC Calculation: 407 R Axis:   44  Text Interpretation: Sinus bradycardia When compared with ECG of 20-May-2024 09:39, No significant change was found Confirmed by Gerard Frederick ([id number]) on 10/21/2024 8:37:37 AM    Zio 11/29/23:   The patient was monitored for 12 days, 15 hours.   The predominant rhythm was sinus with an average rate of 62 bpm in sinus (range 43-101 bpm in sinus).   There were occasional PAC's (1.5% burden) and rare PVC's.   8 supraventricular runs were observed, lasting up to 12 beats with a maximum rate of 184 bpm.   Paroxysmal atrial fibrillation occurred, with an average ventricular rate of 106 bpm (range 53-188 bpm).  Longest episode lasted 1 day, 16 hours.  Atrial fibrillation burden was 35%.   No prolonged pause was observed.   Patient triggered events correspond to sinus rhythm, atrial fibrillation, PSVT, PAC's, and PVC's.   Sinus rhythm with paroxysmal atrial fibrillation, PSVT, and occasional PAC's, as detailed above.  Cath/PCI: R/LHC (07/04/2016): Normal LMCA.  Mild luminal irregularities of the mid LAD.  Codominant LCx/RCA.  20-30% proximal and 30-40% distal RCA stenoses.  Moderate-severe  aortic regurgitation.  Severe aortic stenosis.   CV Surgery: Aortic valve replacement (08/16/2016, Dr. Obadiah): 25 mm [employer] supra-annular mechanical valve    TTE (06/20/2022, [facility]):  Normal LV size with mild-moderate LVH.  LVEF 5-60% with normal wall motion.  Mild-moderate RV dilation with normal function.  Mild biatrial enlargement.  Mechanical aortic valve prosthesis present with mild regurgitation and peak velocity of 2.8 m/s.  AVA 1.95 cm.  Mild mitral annular calcification with mild MR.  Mild TR.  Borderline dilated aortic root measuring 4.2 cm.  Echo 11/20/23: Mildly decreased LV systolic function.  LVEF 45 to 50%. Normal RV size and function. Normal functioning mechanical  aortic valve.  Mean gradient 11 mmHg. Left atrium is normal in size.  Right atrium is normal in size.  Risk Assessment/Calculations STOP-BANG RISK ASSESSMENT       10/21/2024   10:14 AM  STOP-BANG  Do you snore loudly? Yes  Do you often feel tired, fatigued, or sleepy during the daytime? Yes  Has anyone observed you stop breathing during sleep? Yes  Do you have (or are you being treated for) high blood pressure? Yes  Recent BMI (Calculated) 36.09  Is BMI greater than 35 kg/m2? 1=Yes  Age older than 54 years old? 1=Yes  Has large neck size > 40 cm (15.7 in, large male shirt size, large male collar size > 16) No  Gender - Male 1=Yes  STOP-Bang Total Score 7   If STOP-BANG Score >=3 OR two clinical symptoms - patient qualifies for WatchPAT (CPT 95800)      Sleep study ordered due to two (2) of the following clinical symptoms/diagnoses:  Excessive daytime sleepiness G47.10  Gastroesophageal reflux K21.9  Nocturia R35.1  Morning Headaches G44.221  Difficulty concentrating R41.840  Memory problems or poor judgment G31.84  Personality changes or irritability R45.4  Loud snoring R06.83  Depression F32.9  Unrefreshed by sleep G47.8  Impotence N52.9  History of high blood pressure R03.0  Insomnia G47.00  Sleep Disordered Breathing or Sleep Apnea ICD G47.33     CHA2DS2-VASc Score = 2   This indicates a 2.2% annual risk of stroke. The patient's score is based upon: CHF History: 0 HTN History: 1 Diabetes History: 1 Stroke History: 0 Vascular Disease History: 0 Age Score: 0 Gender Score: 0        STOP-Bang Score:  7      Physical Exam VS:  BP 110/80 (BP Location: Left Arm, Patient Position: Sitting, Cuff Size: Normal)   Pulse (!) 55   Ht 5' 7 (1.702 m)   Wt 230 lb 8 oz (104.6 kg)   SpO2 97%   BMI 36.10 kg/m        Wt Readings from Last 3 Encounters:  10/21/24 230 lb 8 oz (104.6 kg)  05/20/24 228 lb 8 oz (103.6 kg)  03/18/24 215 lb 6.4 oz (97.7 kg)    GEN: Well  nourished, well developed in no acute distress NECK: No JVD; No carotid bruits CARDIAC: RRR, II/VI systolic murmur, without rubs or gallops, audible click from mechanical valve RESPIRATORY:  Clear to auscultation without rales, wheezing or rhonchi  ABDOMEN: Soft, non-tender, non-distended EXTREMITIES:  No edema; No deformity   ASSESSMENT AND PLAN Bicuspid aortic valve with severe aortic stenosis status post mechanical valve replacement in 2017.  He has continued on aspirin  81 mg daily and warfarin.  His warfarin continues to be managed by Coumadin  clinic in [place].  He is encouraged to continue with SBE  prophylaxis for dental cleaning.  He has been scheduled for his updated echocardiogram.  He has been advised to notify the office when he needs a refill for his amoxicillin 2000 mg to be taken 1 hour before any dental procedures.  Paroxysmal atrial fibrillation/atrial flutter status post ablation procedure.  EKG today reveals sinus bradycardia with no significant changes.  He has continued on current dosing of metoprolol  to tartrate 37.5 mg in the a.m. and 50 mg in the p.m.  He denies any reoccurrence of his palpitations.  He has also continued on warfarin for CHA2DS2-VASc score of at least 2 for stroke prophylaxis as well as valve maintenance.  He continues with Coumadin  clinic in [place].  Coronary artery disease with prior catheterization in 2017 showed mild to moderate nonobstructive CAD.  Continue with primary prevention to prevent progression of disease with aspirin  81 mg daily and atorvastatin  40 mg daily.  EKG today reveals sinus bradycardia with no ischemic changes.  He continues to deny anginal or anginal equivalents.  No further ischemic evaluation is needed at this time.  Hypertension with blood pressure today 110/80.  Blood pressure continues to be well-controlled.  He is continued on his previous metoprolol  to tartrate dosing and lisinopril  5 mg daily for renal protection due to his diabetes.   He has been encouraged to continue to monitor his pressures 1 to 2 hours postmedication administration at home as well.  Sleep disordered breathing well patient has daytime sleepiness, fatigue, and does snore.  He has a STOP-BANG total score of 7.  He has been ordered a WatchPAT for likely sleep apnea.  Recommendations to follow-up after testing has been completed.  Hyperlipidemia with associated type 2 diabetes with recent labs to be requested from his PCP.  He is currently continued on atorvastatin  40 mg daily.  Hypothyroidism where he is continued on levothyroxine.  Ongoing management per his PCP.       Dispo: Patient to return to clinic to see MD/APP in 11 to 12 months or sooner if needed for reevaluation.  Signed, Micco Bourbeau, NP

## 2024-10-21 NOTE — Patient Instructions (Addendum)
 Medication Instructions:  Your physician recommends that you continue on your current medications as directed. Please refer to the Current Medication list given to you today.   *If you need a refill on your cardiac medications before your next appointment, please call your pharmacy*  Lab Work: No labs ordered today  If you have labs (blood work) drawn today and your tests are completely normal, you will receive your results only by: MyChart Message (if you have MyChart) OR A paper copy in the mail If you have any lab test that is abnormal or we need to change your treatment, we will call you to review the results.  Testing/Procedures: WatchPAT?  Is a FDA cleared portable home sleep study test that uses a watch and 3 points of contact to monitor 7 different channels, including your heart rate, oxygen saturations, body position, snoring, and chest motion.  The study is easy to use from the comfort of your own home and accurately detect sleep apnea.  Before bed, you attach the chest sensor, attached the sleep apnea bracelet to your nondominant hand, and attach the finger probe.  After the study, the raw data is downloaded from the watch and scored for apnea events.   For more information: [email or web address]  Patient Testing Instructions:  Do not put battery into the device until bedtime when you are ready to begin the test. Please call the support number if you need assistance after following the instructions below: 24 hour support line- [phone] or [facility] support at [phone] (option 2)  Download the [facility] WatchPAT One app through the google play store or App Store  Be sure to turn on or enable access to bluetooth in settlings on your smartphone/ device  Make sure no other bluetooth devices are on and within the vicinity of your smartphone/ device and WatchPAT watch during testing.  Make sure to leave your smart phone/ device plugged in and charging all night.   When ready for bed:  Follow the instructions step by step in the WatchPAT One App to activate the testing device. For additional instructions, including video instruction, visit the WatchPAT One video on Youtube. You can search for WatchPat One within Youtube (video is 4 minutes and 18 seconds) or enter: https://youtube/watch?[email or web address]_vbiwxE Please note: You will be prompted to enter a Pin to connect via bluetooth when starting the test. The PIN will be assigned to you when you receive the test.  The device is disposable, but it recommended that you retain the device until you receive a call letting you know the study has been received and the results have been interpreted.  We will let you know if the study did not transmit to us  properly after the test is completed. You do not need to call us  to confirm the receipt of the test.  Please complete the test within 48 hours of receiving PIN.   Frequently Asked Questions:  What is Watch Bruna one?  A single use fully disposable home sleep apnea testing device and will not need to be returned after completion.  What are the requirements to use WatchPAT one?  The be able to have a successful watchpat one sleep study, you should have your Watch pat one device, your smart phone, watch pat one app, your PIN number and Internet access What type of phone do I need?  You should have a smart phone that uses Android 5.1 and above or any Iphone with IOS 10 and above How can I download the WatchPAT one  app?  Based on your device type search for WatchPAT one app either in google play for android devices or APP store for Iphone's Where will I get my PIN for the study?  Your PIN will be provided by your physician's office. It is used for authentication and if you lose/forget your PIN, please reach out to your providers office.  I do not have Internet at home. Can I do WatchPAT one study?  WatchPAT One needs Internet connection throughout the night to be able to  transmit the sleep data. You can use your home/local internet or your cellular's data package. However, it is always recommended to use home/local Internet. It is estimated that between 20MB-30MB will be used with each study.However, the application will be looking for space in the phone to start the study.  What happens if I lose internet or bluetooth connection?  During the internet disconnection, your phone will not be able to transmit the sleep data. All the data, will be stored in your phone. As soon as the internet connection is back on, the phone will being sending the sleep data. During the bluetooth disconnection, WatchPAT one will not be able to to send the sleep data to your phone. Data will be kept in the WatchPAT one until two devices have bluetooth connection back on. As soon as the connection is back on, WatchPAT one will send the sleep data to the phone.  How long do I need to wear the WatchPAT one?  After you start the study, you should wear the device at least 6 hours.  How far should I keep my phone from the device?  During the night, your phone should be within 15 feet.  What happens if I leave the room for restroom or other reasons?  Leaving the room for any reason will not cause any problem. As soon as your get back to the room, both devices will reconnect and will continue to send the sleep data. Can I use my phone during the sleep study?  Yes, you can use your phone as usual during the study. But it is recommended to put your watchpat one on when you are ready to go to bed.  How will I get my study results?  A soon as you completed your study, your sleep data will be sent to the provider. They will then share the results with you when they are ready.   Your physician has requested that you have an echocardiogram. Echocardiography is a painless test that uses sound waves to create images of your heart. It provides your doctor with information about the size and shape of  your heart and how well your heart's chambers and valves are working.   You may receive an ultrasound enhancing agent through an IV if needed to better visualize your heart during the echo. This procedure takes approximately one hour.  There are no restrictions for this procedure.  This will take place at [address] [facility]) [address], [place] [address]  Please note: We ask at that you not bring children with you during ultrasound (echo/ vascular) testing. Due to room size and safety concerns, children are not allowed in the ultrasound rooms during exams. Our front office staff cannot provide observation of children in our lobby area while testing is being conducted. An adult accompanying a patient to their appointment will only be allowed in the ultrasound room at the discretion of the ultrasound technician under special circumstances. We apologize for  any inconvenience.    Follow-Up: At [facility], you and your health needs are our priority.  As part of our continuing mission to provide you with exceptional heart care, our providers are all part of one team.  This team includes your primary Cardiologist (physician) and Advanced Practice Providers or APPs (Physician Assistants and Nurse Practitioners) who all work together to provide you with the care you need, when you need it.  Your next appointment:   12 month(s)  Provider:   You may see Lonni Hanson, MD or one of the following Advanced Practice Providers on your designated Care Team:   Tylene Lunch, NP

## 2024-11-02 ENCOUNTER — Ambulatory Visit: Admitting: Cardiology

## 2024-11-04 ENCOUNTER — Ambulatory Visit: Attending: Internal Medicine | Admitting: *Deleted

## 2024-11-04 DIAGNOSIS — Z952 Presence of prosthetic heart valve: Secondary | ICD-10-CM

## 2024-11-04 DIAGNOSIS — Z5181 Encounter for therapeutic drug level monitoring: Secondary | ICD-10-CM

## 2024-11-04 LAB — POCT INR: INR: 4.3 — AB (ref 2.0–3.0)

## 2024-11-04 NOTE — Progress Notes (Signed)
 INR 4.3; Please see anticoagulation encounter

## 2024-11-04 NOTE — Patient Instructions (Signed)
 Hold warfarin tonight then resume 1 tablet daily except 1 1/2 tablets on Mondays, Wednesday and Fridays Continue salads/greens  Recheck INR in 4 weeks S/P ablation 02/17/24

## 2024-11-05 ENCOUNTER — Other Ambulatory Visit: Payer: Self-pay

## 2024-11-05 DIAGNOSIS — I4891 Unspecified atrial fibrillation: Secondary | ICD-10-CM

## 2024-11-05 MED ORDER — WARFARIN SODIUM 5 MG PO TABS: ORAL_TABLET | ORAL | 1 refills | Status: AC

## 2024-11-18 ENCOUNTER — Ambulatory Visit: Payer: Self-pay | Admitting: Cardiology

## 2024-11-18 ENCOUNTER — Other Ambulatory Visit: Payer: Self-pay | Admitting: Cardiology

## 2024-11-18 ENCOUNTER — Ambulatory Visit: Attending: Cardiology

## 2024-11-18 DIAGNOSIS — Z952 Presence of prosthetic heart valve: Secondary | ICD-10-CM

## 2024-11-18 DIAGNOSIS — I2510 Atherosclerotic heart disease of native coronary artery without angina pectoris: Secondary | ICD-10-CM

## 2024-11-18 DIAGNOSIS — I10 Essential (primary) hypertension: Secondary | ICD-10-CM

## 2024-11-18 DIAGNOSIS — G4730 Sleep apnea, unspecified: Secondary | ICD-10-CM

## 2024-11-18 DIAGNOSIS — E039 Hypothyroidism, unspecified: Secondary | ICD-10-CM

## 2024-11-18 DIAGNOSIS — E1169 Type 2 diabetes mellitus with other specified complication: Secondary | ICD-10-CM

## 2024-11-18 DIAGNOSIS — I4891 Unspecified atrial fibrillation: Secondary | ICD-10-CM

## 2024-11-18 DIAGNOSIS — Q2381 Bicuspid aortic valve: Secondary | ICD-10-CM

## 2024-11-18 LAB — ECHOCARDIOGRAM COMPLETE
AR max vel: 1.81 cm2
AV Area VTI: 1.95 cm2
AV Area mean vel: 1.63 cm2
AV Mean grad: 18 mmHg
AV Peak grad: 27.5 mmHg
Ao pk vel: 2.62 m/s
Area-P 1/2: 4.74 cm2
Calc EF: 50.2 %
MV VTI: 3.15 cm2
S' Lateral: 3.4 cm
Single Plane A2C EF: 49.6 %
Single Plane A4C EF: 55.9 %

## 2024-12-02 ENCOUNTER — Ambulatory Visit: Attending: Internal Medicine

## 2024-12-02 DIAGNOSIS — Z952 Presence of prosthetic heart valve: Secondary | ICD-10-CM | POA: Diagnosis not present

## 2024-12-02 DIAGNOSIS — Z5181 Encounter for therapeutic drug level monitoring: Secondary | ICD-10-CM | POA: Diagnosis not present

## 2024-12-02 LAB — POCT INR: INR: 5.7 — AB (ref 2.0–3.0)

## 2024-12-02 NOTE — Progress Notes (Signed)
 INR 5.7; Please see anticoagulation encounter

## 2024-12-02 NOTE — Patient Instructions (Signed)
 Hold warfarin tonight and tomorrow night then decrease dose to 1 tablet daily except 1 1/2 tablets on Tuesdays and and Fridays Continue salads/greens  Recheck INR in 3 weeks S/P ablation 02/17/24

## 2024-12-23 ENCOUNTER — Ambulatory Visit

## 2024-12-28 ENCOUNTER — Ambulatory Visit: Attending: Internal Medicine | Admitting: *Deleted

## 2024-12-28 DIAGNOSIS — Z5181 Encounter for therapeutic drug level monitoring: Secondary | ICD-10-CM | POA: Diagnosis not present

## 2024-12-28 DIAGNOSIS — Z952 Presence of prosthetic heart valve: Secondary | ICD-10-CM

## 2024-12-28 LAB — POCT INR: INR: 2.9 (ref 2.0–3.0)

## 2024-12-28 NOTE — Patient Instructions (Signed)
 Continue warfarin 1 tablet daily except 1 1/2 tablets on Tuesdays and and Fridays Continue salads/greens  Recheck INR in 4 weeks S/P ablation 02/17/24

## 2024-12-28 NOTE — Progress Notes (Signed)
 INR 2.9; Please see anticoagulation encounter

## 2025-02-03 ENCOUNTER — Ambulatory Visit

## 2025-02-10 ENCOUNTER — Ambulatory Visit
